# Patient Record
Sex: Female | Born: 1970
Health system: Southern US, Community
[De-identification: ages and names within clinical notes are randomized; demographics above are authoritative.]

## PROBLEM LIST (undated history)

## (undated) DIAGNOSIS — R269 Unspecified abnormalities of gait and mobility: Secondary | ICD-10-CM

## (undated) DIAGNOSIS — G809 Cerebral palsy, unspecified: Secondary | ICD-10-CM

## (undated) DIAGNOSIS — I10 Essential (primary) hypertension: Secondary | ICD-10-CM

## (undated) HISTORY — DX: Unspecified abnormalities of gait and mobility: R26.9

## (undated) HISTORY — PX: LEG SURGERY: SHX1003

## (undated) HISTORY — PX: EYE SURGERY: SHX253

## (undated) HISTORY — DX: Essential (primary) hypertension: I10

---

## 2002-04-22 HISTORY — PX: TUBAL LIGATION: SHX77

## 2005-06-29 ENCOUNTER — Emergency Department (HOSPITAL_COMMUNITY): Admission: EM | Admit: 2005-06-29 | Discharge: 2005-06-29 | Payer: Self-pay | Admitting: Family Medicine

## 2005-09-06 ENCOUNTER — Emergency Department (HOSPITAL_COMMUNITY): Admission: EM | Admit: 2005-09-06 | Discharge: 2005-09-06 | Payer: Self-pay | Admitting: Family Medicine

## 2006-08-23 ENCOUNTER — Emergency Department (HOSPITAL_COMMUNITY): Admission: EM | Admit: 2006-08-23 | Discharge: 2006-08-23 | Payer: Self-pay | Admitting: Emergency Medicine

## 2009-01-15 ENCOUNTER — Emergency Department (HOSPITAL_COMMUNITY): Admission: EM | Admit: 2009-01-15 | Discharge: 2009-01-15 | Payer: Self-pay | Admitting: Emergency Medicine

## 2009-11-05 ENCOUNTER — Emergency Department (HOSPITAL_COMMUNITY): Admission: EM | Admit: 2009-11-05 | Discharge: 2009-11-05 | Payer: Self-pay | Admitting: Emergency Medicine

## 2010-07-07 LAB — URINALYSIS, ROUTINE W REFLEX MICROSCOPIC
Ketones, ur: NEGATIVE mg/dL
Nitrite: POSITIVE — AB
Protein, ur: NEGATIVE mg/dL
Urobilinogen, UA: 0.2 mg/dL (ref 0.0–1.0)
pH: 5.5 (ref 5.0–8.0)

## 2010-07-07 LAB — URINE CULTURE
Colony Count: >=100000
Culture  Setup Time: 201107180001

## 2010-07-07 LAB — POCT I-STAT, CHEM 8
Chloride: 105 mEq/L (ref 96–112)
Hemoglobin: 10.5 g/dL — ABNORMAL LOW (ref 12.0–15.0)

## 2010-07-07 LAB — URINE MICROSCOPIC-ADD ON

## 2010-07-07 LAB — POCT PREGNANCY, URINE: Preg Test, Ur: NEGATIVE

## 2013-07-15 ENCOUNTER — Encounter (HOSPITAL_COMMUNITY): Payer: Self-pay | Admitting: Emergency Medicine

## 2013-07-15 ENCOUNTER — Emergency Department (INDEPENDENT_AMBULATORY_CARE_PROVIDER_SITE_OTHER)
Admission: EM | Admit: 2013-07-15 | Discharge: 2013-07-15 | Disposition: A | Discharge status: Home / Self Care | Payer: Self-pay | Source: Home / Self Care | Attending: Family Medicine | Admitting: Family Medicine

## 2013-07-15 DIAGNOSIS — R21 Rash and other nonspecific skin eruption: Secondary | ICD-10-CM

## 2013-07-15 DIAGNOSIS — I10 Essential (primary) hypertension: Secondary | ICD-10-CM

## 2013-07-15 LAB — POCT I-STAT, CHEM 8
BUN: 8 mg/dL (ref 6–23)
Calcium, Ion: 1.11 mmol/L — ABNORMAL LOW (ref 1.12–1.23)
Chloride: 101 mEq/L (ref 96–112)
Creatinine, Ser: 0.6 mg/dL (ref 0.50–1.10)
Glucose, Bld: 154 mg/dL — ABNORMAL HIGH (ref 70–99)
HCT: 34 % — ABNORMAL LOW (ref 36.0–46.0)
Hemoglobin: 11.6 g/dL — ABNORMAL LOW (ref 12.0–15.0)
POTASSIUM: 3.8 meq/L (ref 3.7–5.3)
SODIUM: 140 meq/L (ref 137–147)
TCO2: 24 mmol/L (ref 0–100)

## 2013-07-15 MED ORDER — HYDROCHLOROTHIAZIDE 25 MG PO TABS: 25.0000 mg | ORAL_TABLET | Freq: Every day | ORAL | Status: DC

## 2013-07-15 MED ORDER — TRIAMCINOLONE ACETONIDE 0.1 % EX CREA: 1.0000 "application " | TOPICAL_CREAM | Freq: Two times a day (BID) | CUTANEOUS | Status: DC

## 2013-07-15 NOTE — ED Notes (Signed)
C/o insect bites on right arm States she was told there was a spider nest in her daughters room where she was cleaning yesterday States bites does itch Areas do have pus coming out

## 2013-07-15 NOTE — ED Provider Notes (Signed)
Yvette Snyder is a 43 y.o. female who presents to Urgent Care today for rash. Patient developed several pruritic papules on right upper arm this morning. She suspects that she was going by spiders but denies ever seeing any spiders biting her. The papules are pruritic and not painful. No other family members have similar rash. She has not tried any treatment yet. No fevers chills nausea vomiting or diarrhea..  Additionally patient reports that she has had elevated blood pressure multiple times but has not been diagnosed with hypertension hour she taking any blood pressure medications. No chest pains palpitations or shortness of breath.   History reviewed. No pertinent past medical history. History  Substance Use Topics  . Smoking status: Not on file  . Smokeless tobacco: Not on file  . Alcohol Use: Not on file   ROS as above Medications: No current facility-administered medications for this encounter.   Current Outpatient Prescriptions  Medication Sig Dispense Refill  . hydrochlorothiazide (HYDRODIURIL) 25 MG tablet Take 1 tablet (25 mg total) by mouth daily.  30 tablet  2  . triamcinolone cream (KENALOG) 0.1 % Apply 1 application topically 2 (two) times daily.  30 g  1    Exam:  BP 182/91  Pulse 92  Temp(Src) 98.7 F (37.1 C) (Oral)  Resp 20  SpO2 98%  LMP 06/11/2013 Gen: Well NAD, morbidly obese HEENT: EOMI,  MMM Lungs: Normal work of breathing. CTABL Heart: RRR no MRG Abd: NABS, Soft. NT, ND Exts: Brisk capillary refill, warm and well perfused.  Skin: 5 small (<1cm in diameter) excoriated papules with crusting on the right upper arm. Non tender.   Results for orders placed during the hospital encounter of 07/15/13 (from the past 24 hour(s))  POCT I-STAT, CHEM 8     Status: Abnormal   Collection Time    07/15/13 11:10 AM      Result Value Ref Range   Sodium 140  137 - 147 mEq/L   Potassium 3.8  3.7 - 5.3 mEq/L   Chloride 101  96 - 112 mEq/L   BUN 8  6 - 23 mg/dL   Creatinine, Ser 1.300.60  0.50 - 1.10 mg/dL   Glucose, Bld [id number] (*) 70 - 99 mg/dL   Calcium, Ion 7.841.11 (*) 1.12 - 1.23 mmol/L   TCO2 24  0 - 100 mmol/L   Hemoglobin 11.6 (*) 12.0 - 15.0 g/dL   HCT 69.634.0 (*) 29.536.0 - 28.446.0 %   No results found.  Assessment and Plan: 43 y.o. female with  1) . Papules. Unclear etiology. Possibly that but bites however this not completely characteristic of them. We'll treat with triamcinolone cream and watchful waiting.  2) hypertension: Start hydrochlorothiazide. Refer to [facility]  3) elevated blood glucose. Concerning for diabetes. Patient will require evaluation with primary care provider.  Discussed warning signs or symptoms. Please see discharge instructions. Patient expresses understanding.    Rodolph BongEvan S Wojciech Willetts, MD 07/15/13 1115

## 2013-07-15 NOTE — Discharge Instructions (Signed)
Thank you for coming in today. Please apply triamcinolone cream twice daily until the skin starts looking normal. You can also use Gold Bond Itch over-the-counter for temporary control of itching. Start taking hydrochlorothiazide daily for blood pressure You also will need evaluation for diabetes.   [facility] [address] [place], [place] [address] [phone]  Please call or see Ms Antionette Char- for assistance with your bill.  You may qualify for reduced or free services.  Her phone number is [phone]. Her email is [email or web address]  Call or go to the emergency room if you get worse, have trouble breathing, have chest pains, or palpitations.    Arterial Hypertension Arterial hypertension (high blood pressure) is a condition of elevated pressure in your blood vessels. Hypertension over a long period of time is a risk factor for strokes, heart attacks, and heart failure. It is also the leading cause of kidney (renal) failure.  CAUSES   In Adults -- Over 90% of all hypertension has no known cause. This is called essential or primary hypertension. In the other 10% of people with hypertension, the increase in blood pressure is caused by another disorder. This is called secondary hypertension. Important causes of secondary hypertension are:  Heavy alcohol use.  Obstructive sleep apnea.  Hyperaldosterosim (Conn's syndrome).  Steroid use.  Chronic kidney failure.  Hyperparathyroidism.  Medications.  Renal artery stenosis.  Pheochromocytoma.  Cushing's disease.  Coarctation of the aorta.  Scleroderma renal crisis.  Licorice (in excessive amounts).  Drugs (cocaine, methamphetamine). Your caregiver can explain any items above that apply to you.  In Children -- Secondary hypertension is more common and should always be considered.  Pregnancy -- Few women of childbearing age have high blood pressure. However,  up to 10% of them develop hypertension of pregnancy. Generally, this will not harm the woman. It may be a sign of 3 complications of pregnancy: preeclampsia, HELLP syndrome, and eclampsia. Follow up and control with medication is necessary. SYMPTOMS   This condition normally does not produce any noticeable symptoms. It is usually found during a routine exam.  Malignant hypertension is a late problem of high blood pressure. It may have the following symptoms:  Headaches.  Blurred vision.  End-organ damage (this means your kidneys, heart, lungs, and other organs are being damaged).  Stressful situations can increase the blood pressure. If a person with normal blood pressure has their blood pressure go up while being seen by their caregiver, this is often termed "white coat hypertension." Its importance is not known. It may be related with eventually developing hypertension or complications of hypertension.  Hypertension is often confused with mental tension, stress, and anxiety. DIAGNOSIS  The diagnosis is made by 3 separate blood pressure measurements. They are taken at least 1 week apart from each other. If there is organ damage from hypertension, the diagnosis may be made without repeat measurements. Hypertension is usually identified by having blood pressure readings:  Above 140/90 mmHg measured in both arms, at 3 separate times, over a couple weeks.  Over 130/80 mmHg should be considered a risk factor and may require treatment in patients with diabetes. Blood pressure readings over 120/80 mmHg are called "pre-hypertension" even in non-diabetic patients. To get a true blood pressure measurement, use the following guidelines. Be aware of the factors that can alter blood pressure readings.  Take measurements at least 1 hour after caffeine.  Take measurements 30 minutes after smoking and without any stress. This is another  reason to quit smoking  it raises your blood pressure.  Use a  proper cuff size. Ask your caregiver if you are not sure about your cuff size.  Most home blood pressure cuffs are automatic. They will measure systolic and diastolic pressures. The systolic pressure is the pressure reading at the start of sounds. Diastolic pressure is the pressure at which the sounds disappear. If you are elderly, measure pressures in multiple postures. Try sitting, lying or standing.  Sit at rest for a minimum of 5 minutes before taking measurements.  You should not be on any medications like decongestants. These are found in many cold medications.  Record your blood pressure readings and review them with your caregiver. If you have hypertension:  Your caregiver may do tests to be sure you do not have secondary hypertension (see "causes" above).  Your caregiver may also look for signs of metabolic syndrome. This is also called Syndrome X or Insulin Resistance Syndrome. You may have this syndrome if you have type 2 diabetes, abdominal obesity, and abnormal blood lipids in addition to hypertension.  Your caregiver will take your medical and family history and perform a physical exam.  Diagnostic tests may include blood tests (for glucose, cholesterol, potassium, and kidney function), a urinalysis, or an EKG. Other tests may also be necessary depending on your condition. PREVENTION  There are important lifestyle issues that you can adopt to reduce your chance of developing hypertension:  Maintain a normal weight.  Limit the amount of salt (sodium) in your diet.  Exercise often.  Limit alcohol intake.  Get enough potassium in your diet. Discuss specific advice with your caregiver.  Follow a DASH diet (dietary approaches to stop hypertension). This diet is rich in fruits, vegetables, and low-fat dairy products, and avoids certain fats. PROGNOSIS  Essential hypertension cannot be cured. Lifestyle changes and medical treatment can lower blood pressure and reduce  complications. The prognosis of secondary hypertension depends on the underlying cause. Many people whose hypertension is controlled with medicine or lifestyle changes can live a normal, healthy life.  RISKS AND COMPLICATIONS  While high blood pressure alone is not an illness, it often requires treatment due to its short- and long-term effects on many organs. Hypertension increases your risk for:  CVAs or strokes (cerebrovascular accident).  Heart failure due to chronically high blood pressure (hypertensive cardiomyopathy).  Heart attack (myocardial infarction).  Damage to the retina (hypertensive retinopathy).  Kidney failure (hypertensive nephropathy). Your caregiver can explain list items above that apply to you. Treatment of hypertension can significantly reduce the risk of complications. TREATMENT   For overweight patients, weight loss and regular exercise are recommended. Physical fitness lowers blood pressure.  Mild hypertension is usually treated with diet and exercise. A diet rich in fruits and vegetables, fat-free dairy products, and foods low in fat and salt (sodium) can help lower blood pressure. Decreasing salt intake decreases blood pressure in a 1/3 of people.  Stop smoking if you are a smoker. The steps above are highly effective in reducing blood pressure. While these actions are easy to suggest, they are difficult to achieve. Most patients with moderate or severe hypertension end up requiring medications to bring their blood pressure down to a normal level. There are several classes of medications for treatment. Blood pressure pills (antihypertensives) will lower blood pressure by their different actions. Lowering the blood pressure by 10 mmHg may decrease the risk of complications by as much as 25%. The goal of treatment is effective  blood pressure control. This will reduce your risk for complications. Your caregiver will help you determine the best treatment for you  according to your lifestyle. What is excellent treatment for one person, may not be for you. HOME CARE INSTRUCTIONS   Do not smoke.  Follow the lifestyle changes outlined in the "Prevention" section.  If you are on medications, follow the directions carefully. Blood pressure medications must be taken as prescribed. Skipping doses reduces their benefit. It also puts you at risk for problems.  Follow up with your caregiver, as directed.  If you are asked to monitor your blood pressure at home, follow the guidelines in the "Diagnosis" section above. SEEK MEDICAL CARE IF:   You think you are having medication side effects.  You have recurrent headaches or lightheadedness.  You have swelling in your ankles.  You have trouble with your vision. SEEK IMMEDIATE MEDICAL CARE IF:   You have sudden onset of chest pain or pressure, difficulty breathing, or other symptoms of a heart attack.  You have a severe headache.  You have symptoms of a stroke (such as sudden weakness, difficulty speaking, difficulty walking). MAKE SURE YOU:   Understand these instructions.  Will watch your condition.  Will get help right away if you are not doing well or get worse. Document Released: 04/08/2005 Document Revised: 07/01/2011 Document Reviewed: 11/06/2006 [facility] Patient Information 2014 [place], [place].

## 2013-07-16 ENCOUNTER — Telehealth (HOSPITAL_COMMUNITY): Payer: Self-pay | Admitting: Family Medicine

## 2013-07-16 MED ORDER — AMLODIPINE BESYLATE 10 MG PO TABS: 10.0000 mg | ORAL_TABLET | Freq: Every day | ORAL | Status: DC

## 2013-07-16 NOTE — ED Notes (Signed)
[facility], who wises to advise of pt sulfa allergy. Discussed w Dr Denyse Amassorey, and pt is not to fill rx, and is instead to follow up w [facility] clinic

## 2013-07-16 NOTE — ED Notes (Signed)
Patient called clinic to discuss Rx w MD. MD called Rx in to pharmacy for patient

## 2013-07-16 NOTE — ED Notes (Signed)
Pt is allergic to sulfa.  HCTZ has cross reactivity.  Called in norvasc.    Rodolph BongEvan S Payton Moder, MD 07/16/13 1228

## 2013-09-30 ENCOUNTER — Encounter: Payer: Self-pay | Admitting: Internal Medicine

## 2013-09-30 ENCOUNTER — Ambulatory Visit: Payer: Self-pay | Attending: Internal Medicine | Admitting: Internal Medicine

## 2013-09-30 ENCOUNTER — Other Ambulatory Visit (HOSPITAL_COMMUNITY)
Admission: RE | Admit: 2013-09-30 | Discharge: 2013-09-30 | Disposition: A | Discharge status: Home / Self Care | Payer: Self-pay | Source: Ambulatory Visit | Attending: Internal Medicine | Admitting: Internal Medicine

## 2013-09-30 VITALS — BP 160/99 | HR 94 | Temp 98.6°F | Resp 16 | Ht 60.0 in | Wt 252.0 lb

## 2013-09-30 DIAGNOSIS — R079 Chest pain, unspecified: Secondary | ICD-10-CM | POA: Insufficient documentation

## 2013-09-30 DIAGNOSIS — Z7189 Other specified counseling: Secondary | ICD-10-CM

## 2013-09-30 DIAGNOSIS — Z7689 Persons encountering health services in other specified circumstances: Secondary | ICD-10-CM

## 2013-09-30 DIAGNOSIS — Y929 Unspecified place or not applicable: Secondary | ICD-10-CM | POA: Insufficient documentation

## 2013-09-30 DIAGNOSIS — R358 Other polyuria: Secondary | ICD-10-CM

## 2013-09-30 DIAGNOSIS — Z1151 Encounter for screening for human papillomavirus (HPV): Secondary | ICD-10-CM | POA: Insufficient documentation

## 2013-09-30 DIAGNOSIS — W57XXXA Bitten or stung by nonvenomous insect and other nonvenomous arthropods, initial encounter: Secondary | ICD-10-CM | POA: Insufficient documentation

## 2013-09-30 DIAGNOSIS — T148 Other injury of unspecified body region: Secondary | ICD-10-CM | POA: Insufficient documentation

## 2013-09-30 DIAGNOSIS — Z01419 Encounter for gynecological examination (general) (routine) without abnormal findings: Secondary | ICD-10-CM | POA: Insufficient documentation

## 2013-09-30 DIAGNOSIS — I10 Essential (primary) hypertension: Secondary | ICD-10-CM | POA: Insufficient documentation

## 2013-09-30 DIAGNOSIS — L538 Other specified erythematous conditions: Secondary | ICD-10-CM | POA: Insufficient documentation

## 2013-09-30 DIAGNOSIS — Z882 Allergy status to sulfonamides status: Secondary | ICD-10-CM | POA: Insufficient documentation

## 2013-09-30 DIAGNOSIS — R3589 Other polyuria: Secondary | ICD-10-CM

## 2013-09-30 DIAGNOSIS — Z23 Encounter for immunization: Secondary | ICD-10-CM

## 2013-09-30 DIAGNOSIS — Z79899 Other long term (current) drug therapy: Secondary | ICD-10-CM | POA: Insufficient documentation

## 2013-09-30 DIAGNOSIS — Z124 Encounter for screening for malignant neoplasm of cervix: Secondary | ICD-10-CM

## 2013-09-30 DIAGNOSIS — L304 Erythema intertrigo: Secondary | ICD-10-CM

## 2013-09-30 LAB — COMPLETE METABOLIC PANEL WITH GFR
ALBUMIN: 4.1 g/dL (ref 3.5–5.2)
ALK PHOS: 86 U/L (ref 39–117)
ALT: 13 U/L (ref 0–35)
AST: 11 U/L (ref 0–37)
BILIRUBIN TOTAL: 0.3 mg/dL (ref 0.2–1.2)
BUN: 11 mg/dL (ref 6–23)
CHLORIDE: 102 meq/L (ref 96–112)
CO2: 26 mEq/L (ref 19–32)
CREATININE: 0.55 mg/dL (ref 0.50–1.10)
Calcium: 9 mg/dL (ref 8.4–10.5)
GFR, Est Non African American: >89 mL/min
Glucose, Bld: 91 mg/dL (ref 70–99)
Potassium: 4.5 mEq/L (ref 3.5–5.3)
Sodium: 137 mEq/L (ref 135–145)
TOTAL PROTEIN: 7 g/dL (ref 6.0–8.3)

## 2013-09-30 LAB — POCT URINALYSIS DIPSTICK
Bilirubin, UA: NEGATIVE
Blood, UA: NEGATIVE
Glucose, UA: NEGATIVE
Ketones, UA: NEGATIVE
LEUKOCYTES UA: NEGATIVE
NITRITE UA: NEGATIVE
PH UA: 5.5
PROTEIN UA: NEGATIVE
Spec Grav, UA: >=1.03
Urobilinogen, UA: NEGATIVE

## 2013-09-30 LAB — CBC
HCT: 35.4 % — ABNORMAL LOW (ref 36.0–46.0)
Hemoglobin: 11.4 g/dL — ABNORMAL LOW (ref 12.0–15.0)
MCH: 22.9 pg — ABNORMAL LOW (ref 26.0–34.0)
MCHC: 32.2 g/dL (ref 30.0–36.0)
MCV: 71.1 fL — AB (ref 78.0–100.0)
Platelets: 351 10*3/uL (ref 150–400)
RBC: 4.98 MIL/uL (ref 3.87–5.11)
RDW: 17.2 % — ABNORMAL HIGH (ref 11.5–15.5)
WBC: 10.7 10*3/uL — ABNORMAL HIGH (ref 4.0–10.5)

## 2013-09-30 LAB — TSH: TSH: 1.449 u[IU]/mL (ref 0.350–4.500)

## 2013-09-30 LAB — HEMOGLOBIN A1C
Hgb A1c MFr Bld: 6 % — ABNORMAL HIGH (ref <5.7)
MEAN PLASMA GLUCOSE: 126 mg/dL — AB (ref <117)

## 2013-09-30 MED ORDER — NYSTATIN 100000 UNIT/GM EX CREA: 1.0000 "application " | TOPICAL_CREAM | Freq: Two times a day (BID) | CUTANEOUS | Status: DC

## 2013-09-30 MED ORDER — FLUCONAZOLE 150 MG PO TABS: 150.0000 mg | ORAL_TABLET | Freq: Once | ORAL | Status: DC

## 2013-09-30 MED ORDER — BACITRACIN 500 UNIT/GM EX OINT: 1.0000 "application " | TOPICAL_OINTMENT | Freq: Two times a day (BID) | CUTANEOUS | Status: DC

## 2013-09-30 MED ORDER — AMLODIPINE BESYLATE 10 MG PO TABS: 10.0000 mg | ORAL_TABLET | Freq: Every day | ORAL | Status: DC

## 2013-09-30 NOTE — Progress Notes (Signed)
Pt here to establish care for hypertension controlled with Amlodipine 10 mg daily. States she ran out 08/20/13 No other medical hx noted Pt was seen in ER 07/15/13 for multiple spider bites, prescribed Triamcinolone cream which she states is not working Bites noted to left arm,legs with itchiness LMP -5/17 Pt requesting pap smear Mother died of diabetes,copd

## 2013-09-30 NOTE — Progress Notes (Signed)
Patient ID: Elmarie Shiley, female   DOB: [birth year], 43 y.o.   MRN: [id number]  YQM:[id number]  [id number]:[id number]  DOB - [birth year]  CC:  Chief Complaint  Patient presents with  . Hospitalization Follow-up  . Hypertension  . Insect Bite       HPI: Keonte Standfield is a 43 y.o. female with a past medical history of cerebral palsy and HTN, here today to establish medical care.  Patient reports that she was seen at the urgent care on 3/26 with spider bites on R arms which have now resolved.  Today she presents with multiple pruritic insect bits on left arm for one week. Patient denies pain or warmth.  She reports that she has not had a regular physician due to insurance concerns and has been out of her Norvasc since April 30th.  She reports that she has never had a mammogram and has not had a pap smear in 11 years.  Patient needs tetanus vaccine today.   Patient has No headache, No chest pain, No abdominal pain - No Nausea, No new weakness tingling or numbness, No Cough - SOB.  Allergies  Allergen Reactions  . Sulfa Antibiotics    Past Medical History  Diagnosis Date  . Hypertension    Current Outpatient Prescriptions on File Prior to Visit  Medication Sig Dispense Refill  . triamcinolone cream (KENALOG) 0.1 % Apply 1 application topically 2 (two) times daily.  30 g  1  . amLODipine (NORVASC) 10 MG tablet Take 1 tablet (10 mg total) by mouth daily.  30 tablet  1   No current facility-administered medications on file prior to visit.   Family History  Problem Relation Age of Onset  . Diabetes Mother   . COPD Mother    History   Social History  . Marital Status: Married    Spouse Name: N/A    Number of Children: N/A  . Years of Education: N/A   Occupational History  . Not on file.   Social History Main Topics  . Smoking status: Never Smoker   . Smokeless tobacco: Not on file  . Alcohol Use: No  . Drug Use: No  . Sexual Activity: Not on file   Other Topics Concern  . Not  on file   Social History Narrative  . No narrative on file    Review of Systems  Constitutional: Negative.   HENT: Negative.   Eyes: Negative.   Respiratory: Negative.   Cardiovascular: Negative for chest pain, palpitations and leg swelling.  Gastrointestinal: Negative.   Genitourinary: Positive for dysuria (occasionally with pelvic pressure).       Polydipsia   Musculoskeletal: Negative.   Skin: Positive for itching.  Neurological: Negative.   Endo/Heme/Allergies: Negative for polydipsia.  Psychiatric/Behavioral: Positive for depression (since mom passed away one month ago).      Objective:   Filed Vitals:   09/30/13 0918  BP: 160/99  Pulse: 94  Temp: 98.6 F (37 C)  Resp: 16   Physical Exam  Constitutional: She is oriented to person, place, and time. She appears well-nourished. She appears distressed.  HENT:  Head: Normocephalic.  Right Ear: External ear normal.  Left Ear: External ear normal.  Nose: Nose normal.  Mouth/Throat: Oropharynx is clear and moist.  Eyes: Conjunctivae and EOM are normal. Pupils are equal, round, and reactive to light. No scleral icterus.  Neck: Normal range of motion. Neck supple. No JVD present.  Cardiovascular: Normal rate, regular rhythm and normal heart sounds.  Pulmonary/Chest: Effort normal and breath sounds normal. Right breast exhibits no mass, no nipple discharge and no tenderness. Left breast exhibits no mass, no nipple discharge and no tenderness.  Abdominal: Soft. Bowel sounds are normal. Hernia confirmed negative in the right inguinal area and confirmed negative in the left inguinal area.  Genitourinary: Uterus normal. There is rash on the right labia. There is rash on the left labia. Cervix exhibits discharge (thick white discharge). Cervix exhibits no motion tenderness and no friability. Right adnexum displays mass. Right adnexum displays no tenderness. Left adnexum displays mass. Left adnexum displays no tenderness. There  is erythema around the vagina.  Musculoskeletal: She exhibits no edema and no tenderness.  Patient has 5/5 strenght in all extremities Decreased ROM in bilateral lower extremities d/t history of CP.  Very stiff BLE.  Lymphadenopathy:    She has no cervical adenopathy.       Right: No inguinal adenopathy present.       Left: No inguinal adenopathy present.  Neurological: She is alert and oriented to person, place, and time. She has normal reflexes.  Skin: Skin is warm and dry.     Psychiatric: She has a normal mood and affect. Her behavior is normal.     Lab Results  Component Value Date   HGB 11.6* 07/15/2013   HCT 34.0* 07/15/2013   Lab Results  Component Value Date   CREATININE 0.60 07/15/2013   BUN 8 07/15/2013   NA 140 07/15/2013   K 3.8 07/15/2013   CL 101 07/15/2013    No results found for this basename: HGBA1C   Lipid Panel  No results found for this basename: chol, trig, hdl, cholhdl, vldl, ldlcalc       Assessment and plan:   Hilbert CorriganLorrie was seen today for hospitalization follow-up, hypertension and insect bite.  Diagnoses and associated orders for this visit:  HTN (hypertension) - amLODipine (NORVASC) 10 MG tablet; Take 1 tablet (10 mg total) by mouth daily.  Will follow up for recheck in one week  Morbid obesity Education provided on weight management Encounter to establish care - CBC - COMPLETE METABOLIC PANEL WITH GFR - Hemoglobin A1C - TSH - Vitamin D, 25-hydroxy - MM Digital Screening; Future - Tdap vaccine greater than or equal to 7yo IM  Papanicolaou smear - Cytology - PAP Iraan - Cervicovaginal ancillary only  Intertrigo - fluconazole (DIFLUCAN) 150 MG tablet; Take 1 tablet (150 mg total) by mouth once. - nystatin cream (MYCOSTATIN); Apply 1 application topically 2 (two) times daily. Use in skin folds for yeast Educated patient on staying dry to prevent candidiasis in skin folds Polyuria - POCT urinalysis dipstick-negative. Will test for  DM Insect bite - bacitracin 500 UNIT/GM ointment; Apply 1 application topically 2 (two) times daily. For insect bites on arm   Return for BP recheck on 17th, Nurse Visit.    Holland CommonsKECK, VALERIE, NP-C [facility] [phone] 09/30/2013, 9:36 AM

## 2013-09-30 NOTE — Patient Instructions (Signed)
DASH Diet  The DASH diet stands for "Dietary Approaches to Stop Hypertension." It is a healthy eating plan that has been shown to reduce high blood pressure (hypertension) in as little as 14 days, while also possibly providing other significant health benefits. These other health benefits include reducing the risk of breast cancer after menopause and reducing the risk of type 2 diabetes, heart disease, colon cancer, and stroke. Health benefits also include weight loss and slowing kidney failure in patients with chronic kidney disease.   DIET GUIDELINES  · Limit salt (sodium). Your diet should contain less than 1500 mg of sodium daily.  · Limit refined or processed carbohydrates. Your diet should include mostly whole grains. Desserts and added sugars should be used sparingly.  · Include small amounts of heart-healthy fats. These types of fats include nuts, oils, and tub margarine. Limit saturated and trans fats. These fats have been shown to be harmful in the body.  CHOOSING FOODS   The following food groups are based on a 2000 calorie diet. See your Registered Dietitian for individual calorie needs.  Grains and Grain Products (6 to 8 servings daily)  · Eat More Often: Whole-wheat bread, brown rice, whole-grain or wheat pasta, quinoa, popcorn without added fat or salt (air popped).  · Eat Less Often: White bread, white pasta, white rice, cornbread.  Vegetables (4 to 5 servings daily)  · Eat More Often: Fresh, frozen, and canned vegetables. Vegetables may be raw, steamed, roasted, or grilled with a minimal amount of fat.  · Eat Less Often/Avoid: Creamed or fried vegetables. Vegetables in a cheese sauce.  Fruit (4 to 5 servings daily)  · Eat More Often: All fresh, canned (in natural juice), or frozen fruits. Dried fruits without added sugar. One hundred percent fruit juice (½ cup [237 mL] daily).  · Eat Less Often: Dried fruits with added sugar. Canned fruit in light or heavy syrup.  Lean Meats, Fish, and Poultry (2  servings or less daily. One serving is 3 to 4 oz [85-114 g]).  · Eat More Often: Ninety percent or leaner ground beef, tenderloin, sirloin. Round cuts of beef, chicken breast, turkey breast. All fish. Grill, bake, or broil your meat. Nothing should be fried.  · Eat Less Often/Avoid: Fatty cuts of meat, turkey, or chicken leg, thigh, or wing. Fried cuts of meat or fish.  Dairy (2 to 3 servings)  · Eat More Often: Low-fat or fat-free milk, low-fat plain or light yogurt, reduced-fat or part-skim cheese.  · Eat Less Often/Avoid: Milk (whole, 2%). Whole milk yogurt. Full-fat cheeses.  Nuts, Seeds, and Legumes (4 to 5 servings per week)  · Eat More Often: All without added salt.  · Eat Less Often/Avoid: Salted nuts and seeds, canned beans with added salt.  Fats and Sweets (limited)  · Eat More Often: Vegetable oils, tub margarines without trans fats, sugar-free gelatin. Mayonnaise and salad dressings.  · Eat Less Often/Avoid: Coconut oils, palm oils, butter, stick margarine, cream, half and half, cookies, candy, pie.  FOR MORE INFORMATION  The Dash Diet Eating Plan: www.dashdiet.org  Document Released: 03/28/2011 Document Revised: 07/01/2011 Document Reviewed: 03/28/2011  [employer]® Patient Information ©2014 [employer].

## 2013-10-01 ENCOUNTER — Telehealth: Payer: Self-pay | Admitting: *Deleted

## 2013-10-01 LAB — VITAMIN D 25 HYDROXY (VIT D DEFICIENCY, FRACTURES): VIT D 25 HYDROXY: 34 ng/mL (ref 30–89)

## 2013-10-01 NOTE — Telephone Encounter (Signed)
I called pt just to f/u with her. She stated that she understood her treatment and that she enjoyed her experience here and also she wanted to remain Ms. [name] NP pt. She had no questions and said she will come in on her next appointment.

## 2013-10-04 ENCOUNTER — Telehealth: Payer: Self-pay | Admitting: Emergency Medicine

## 2013-10-04 LAB — CYTOLOGY - PAP

## 2013-10-04 MED ORDER — METRONIDAZOLE 500 MG PO TABS: 500.0000 mg | ORAL_TABLET | Freq: Two times a day (BID) | ORAL | Status: DC

## 2013-10-04 MED ORDER — FERROUS SULFATE 325 (65 FE) MG PO TBEC: 325.0000 mg | DELAYED_RELEASE_TABLET | Freq: Three times a day (TID) | ORAL | Status: AC

## 2013-10-04 NOTE — Telephone Encounter (Signed)
Message copied by Darlis LoanSMITH, Abisai Coble D on Mon Oct 04, 2013  2:59 PM ------      Message from: Ambrose FinlandKECK, VALERIE A      Created: Sun Oct 03, 2013  6:30 PM       Patient has iron deficiency anemia. Please send ferrous sulfate 325 mg to be taken TID. Patient may need stool softener to help with constipation. Negative for STD's but is positive for Bacterial Vaginosis. Please send metronidazole  Mg BID for 7 days, no alcohol with this medication.  Please reinforce weight loss and exercise with this patient.  Thanks ------

## 2013-10-04 NOTE — Telephone Encounter (Signed)
Pt given lab results/pap smear results. Instructed to start taking prescribed Ferrous Sulfate 325 mg TID and Flagyl 500 mg BID. Medications sent to [facility] pharmacy. Pt also reinforced the need to exercise as well as diet control. Pt expressed, she used to walk with hx cerebral palsy but after having daughter she is w/c bound. States its hard for her to exercise. Information provided on ways to exercise

## 2013-10-06 ENCOUNTER — Other Ambulatory Visit: Payer: Self-pay | Admitting: Internal Medicine

## 2013-10-06 ENCOUNTER — Ambulatory Visit: Payer: Self-pay | Attending: Internal Medicine

## 2013-10-06 ENCOUNTER — Telehealth: Payer: Self-pay

## 2013-10-06 NOTE — Telephone Encounter (Signed)
Patient is aware of her pap results.

## 2013-10-06 NOTE — Telephone Encounter (Signed)
Message copied by Lestine MountJUAREZ, DENISE L on Wed Oct 06, 2013  2:20 PM ------      Message from: Holland CommonsKECK, VALERIE A      Created: Tue Oct 05, 2013 10:33 PM       Pap is normal.  She will not need a repeat pap for 3 years. ------

## 2013-10-13 ENCOUNTER — Other Ambulatory Visit: Payer: Self-pay | Admitting: Internal Medicine

## 2013-10-13 DIAGNOSIS — Z803 Family history of malignant neoplasm of breast: Secondary | ICD-10-CM

## 2013-10-13 DIAGNOSIS — Z1231 Encounter for screening mammogram for malignant neoplasm of breast: Secondary | ICD-10-CM

## 2013-11-03 ENCOUNTER — Ambulatory Visit: Payer: Self-pay

## 2013-12-30 ENCOUNTER — Encounter: Payer: Self-pay | Admitting: Internal Medicine

## 2013-12-30 ENCOUNTER — Ambulatory Visit: Payer: Self-pay | Attending: Internal Medicine | Admitting: Internal Medicine

## 2013-12-30 VITALS — BP 142/98 | HR 99 | Temp 98.2°F | Resp 14 | Ht 60.0 in | Wt 249.0 lb

## 2013-12-30 DIAGNOSIS — Z79899 Other long term (current) drug therapy: Secondary | ICD-10-CM | POA: Insufficient documentation

## 2013-12-30 DIAGNOSIS — I10 Essential (primary) hypertension: Secondary | ICD-10-CM

## 2013-12-30 DIAGNOSIS — T148 Other injury of unspecified body region: Secondary | ICD-10-CM | POA: Insufficient documentation

## 2013-12-30 DIAGNOSIS — Z23 Encounter for immunization: Secondary | ICD-10-CM

## 2013-12-30 DIAGNOSIS — W57XXXA Bitten or stung by nonvenomous insect and other nonvenomous arthropods, initial encounter: Secondary | ICD-10-CM

## 2013-12-30 DIAGNOSIS — Z882 Allergy status to sulfonamides status: Secondary | ICD-10-CM | POA: Insufficient documentation

## 2013-12-30 MED ORDER — TRIAMCINOLONE ACETONIDE 0.1 % EX CREA: 1.0000 "application " | TOPICAL_CREAM | Freq: Two times a day (BID) | CUTANEOUS | Status: DC

## 2013-12-30 NOTE — Progress Notes (Signed)
Pt is here following up on her HTN and her bug bites on her arms and legs. Pt states that she injured her leg yesterday and she is now having occasional pain.

## 2013-12-30 NOTE — Progress Notes (Signed)
Patient ID: Yvette Snyder, female   DOB: [birth year], 43 y.o.   MRN: [id number]  CC: HTN, bug bites  HPI: Patient presents to clinic today for a re-evaluation of hypertension and bug bites.  She reports that she has been forgetting to take her blood pressure weekly.  She reports that she may only take her medication three times per week.  She reports that the same bug bites from last visit are present and are becoming increasingly pruritic.  She admits that she has two dogs at home that live indoors which have been itching as well.  She denies fevers or chills.  She also c/o of a pulled muscle in her right thigh since last night.   Allergies  Allergen Reactions  . Sulfa Antibiotics    Past Medical History  Diagnosis Date  . Hypertension    Current Outpatient Prescriptions on File Prior to Visit  Medication Sig Dispense Refill  . amLODipine (NORVASC) 10 MG tablet Take 1 tablet (10 mg total) by mouth daily.  30 tablet  2  . bacitracin 500 UNIT/GM ointment Apply 1 application topically 2 (two) times daily. For insect bites on arm  15 g  1  . nystatin cream (MYCOSTATIN) Apply 1 application topically 2 (two) times daily. Use in skin folds for yeast  30 g  0  . triamcinolone cream (KENALOG) 0.1 % Apply 1 application topically 2 (two) times daily.  30 g  1  . ferrous sulfate 325 (65 FE) MG EC tablet Take 1 tablet (325 mg total) by mouth 3 (three) times daily with meals.  90 tablet  0  . fluconazole (DIFLUCAN) 150 MG tablet Take 1 tablet (150 mg total) by mouth once.  1 tablet  0  . metroNIDAZOLE (FLAGYL) 500 MG tablet Take 1 tablet (500 mg total) by mouth 2 (two) times daily.  14 tablet  0   No current facility-administered medications on file prior to visit.   Family History  Problem Relation Age of Onset  . Diabetes Mother   . COPD Mother    History   Social History  . Marital Status: Married    Spouse Name: N/A    Number of Children: N/A  . Years of Education: N/A   Occupational  History  . Not on file.   Social History Main Topics  . Smoking status: Never Smoker   . Smokeless tobacco: Not on file  . Alcohol Use: No  . Drug Use: No  . Sexual Activity: Not on file   Other Topics Concern  . Not on file   Social History Narrative  . No narrative on file   Review of Systems  Constitutional: Negative for fever and chills.  Respiratory: Negative.   Cardiovascular: Negative for chest pain, palpitations and leg swelling.  Skin: Positive for itching and rash.  Neurological: Positive for headaches. Negative for dizziness.      Objective:   Filed Vitals:   12/30/13 0936  BP: 142/98  Pulse: 99  Temp: 98.2 F (36.8 C)  Resp: 14    Physical Exam  Constitutional: She is oriented to person, place, and time.  Cardiovascular: Normal rate, regular rhythm and normal heart sounds.   Pulmonary/Chest: Effort normal and breath sounds normal.  Musculoskeletal: She exhibits no edema and no tenderness.  Neurological: She is alert and oriented to person, place, and time.  Skin: Skin is warm and dry. Rash (generalized on BUE) noted. There is erythema.  Psychiatric: She has a normal mood  and affect. Thought content normal.     Lab Results  Component Value Date   WBC 10.7* 09/30/2013   HGB 11.4* 09/30/2013   HCT 35.4* 09/30/2013   MCV 71.1* 09/30/2013   PLT 351 09/30/2013   Lab Results  Component Value Date   CREATININE 0.55 09/30/2013   BUN 11 09/30/2013   NA 137 09/30/2013   K 4.5 09/30/2013   CL 102 09/30/2013   CO2 26 09/30/2013    Lab Results  Component Value Date   HGBA1C 6.0* 09/30/2013   Lipid Panel  No results found for this basename: chol, trig, hdl, cholhdl, vldl, ldlcalc       Assessment and plan:   Yvette Snyder was seen today for follow-up.  Diagnoses and associated orders for this visit:  Essential hypertension Patient is non-complaint with medication management.  Stressed the importance of compliance and risk associated with long standing  untreated hypertension.     Bug bites - triamcinolone cream (KENALOG) 0.1 %; Apply 1 application topically 2 (two) times daily. Looks as if they are consistent with flea bites. She may use benadryl for itch. Explained that she may need to have her dogs vaccinated and house cleaned thoroughly to get rid of fleas.  Need for prophylactic vaccination and inoculation against influenza Received  Return in about 3 months (around 03/31/2014) for Hypertension.      Holland Commons, NP-C [facility] [phone] 01/14/2014, 5:10 PM

## 2013-12-30 NOTE — Patient Instructions (Addendum)
Mat take benadryl for itch and use new cream twice per day   Insect Bite Mosquitoes, flies, fleas, bedbugs, and many other insects can bite. Insect bites are different from insect stings. A sting is when venom is injected into the skin. Some insect bites can transmit infectious diseases. SYMPTOMS  Insect bites usually turn red, swell, and itch for 2 to 4 days. They often go away on their own. TREATMENT  Your caregiver may prescribe antibiotic medicines if a bacterial infection develops in the bite. HOME CARE INSTRUCTIONS  Do not scratch the bite area.  Keep the bite area clean and dry. Wash the bite area thoroughly with soap and water.  Put ice or cool compresses on the bite area.  Put ice in a plastic bag.  Place a towel between your skin and the bag.  Leave the ice on for 20 minutes, 4 times a day for the first 2 to 3 days, or as directed.  You may apply a baking soda paste, cortisone cream, or calamine lotion to the bite area as directed by your caregiver. This can help reduce itching and swelling.  Only take over-the-counter or prescription medicines as directed by your caregiver.  If you are given antibiotics, take them as directed. Finish them even if you start to feel better. You may need a tetanus shot if:  You cannot remember when you had your last tetanus shot.  You have never had a tetanus shot.  The injury broke your skin. If you get a tetanus shot, your arm may swell, get red, and feel warm to the touch. This is common and not a problem. If you need a tetanus shot and you choose not to have one, there is a rare chance of getting tetanus. Sickness from tetanus can be serious. SEEK IMMEDIATE MEDICAL CARE IF:   You have increased pain, redness, or swelling in the bite area.  You see a red line on the skin coming from the bite.  You have a fever.  You have joint pain.  You have a headache or neck pain.  You have unusual weakness.  You have a rash.  You  have chest pain or shortness of breath.  You have abdominal pain, nausea, or vomiting.  You feel unusually tired or sleepy. MAKE SURE YOU:   Understand these instructions.  Will watch your condition.  Will get help right away if you are not doing well or get worse. Document Released: 05/16/2004 Document Revised: 07/01/2011 Document Reviewed: 11/07/2010 [employer] Patient Information 2015 [place], [place]. This information is not intended to replace advice given to you by your health care provider. Make sure you discuss any questions you have with your health care provider.

## 2014-01-11 DIAGNOSIS — I10 Essential (primary) hypertension: Secondary | ICD-10-CM | POA: Insufficient documentation

## 2014-02-21 ENCOUNTER — Ambulatory Visit: Payer: Self-pay | Attending: Internal Medicine | Admitting: Internal Medicine

## 2014-02-21 DIAGNOSIS — Z0000 Encounter for general adult medical examination without abnormal findings: Secondary | ICD-10-CM | POA: Insufficient documentation

## 2014-02-21 DIAGNOSIS — N912 Amenorrhea, unspecified: Secondary | ICD-10-CM

## 2014-02-21 LAB — POCT URINE PREGNANCY: PREG TEST UR: NEGATIVE

## 2014-02-21 NOTE — Patient Instructions (Signed)
Secondary Amenorrhea  Secondary amenorrhea is the stopping of menstrual flow for 3-6 months in a female who has previously had periods. There are many possible causes. Most of these causes are not serious. Usually, treating the underlying problem causing the loss of menses will return your periods to normal. CAUSES  Some common and uncommon causes of not menstruating include:  Malnutrition.  Low blood sugar (hypoglycemia).  Polycystic ovary disease.  Stress or fear.  Breastfeeding.  Hormone imbalance.  Ovarian failure.  Medicines.  Extreme obesity.  Cystic fibrosis.  Low body weight or drastic weight reduction from any cause.  Early menopause.  Removal of ovaries or uterus.  Contraceptives.  Illness.  Long-term (chronic) illnesses.  Cushing syndrome.  Thyroid problems.  Birth control pills, patches, or vaginal rings for birth control. RISK FACTORS You may be at greater risk of secondary amenorrhea if:  You have a family history of this condition.  You have an eating disorder.  You do athletic training. DIAGNOSIS  A diagnosis is made by your health care provider taking a medical history and doing a physical exam. This will include a pelvic exam to check for problems with your reproductive organs. Pregnancy must be ruled out. Often, numerous blood tests are done to measure different hormones in the body. Urine testing may be done. Specialized exams (ultrasound, CT scan, MRI, or hysteroscopy) may have to be done as well as measuring the body mass index (BMI). TREATMENT  Treatment depends on the cause of the amenorrhea. If an eating disorder is present, this can be treated with an adequate diet and therapy. Chronic illnesses may improve with treatment of the illness. Amenorrhea may be corrected with medicines, lifestyle changes, or surgery. If the amenorrhea cannot be corrected, it is sometimes possible to create a false menstruation with medicines. HOME CARE  INSTRUCTIONS  Maintain a healthy diet.  Manage weight problems.  Exercise regularly but not excessively.  Get adequate sleep.  Manage stress.  Be aware of changes in your menstrual cycle. Keep a record of when your periods occur. Note the date your period starts, how long it lasts, and any problems. SEEK MEDICAL CARE IF: Your symptoms do not get better with treatment. Document Released: 05/20/2006 Document Revised: 12/09/2012 Document Reviewed: 09/24/2012 [employer] Patient Information 2015 [employer]. This information is not intended to replace advice given to you by your health care provider. Make sure you discuss any questions you have with your health care provider.  

## 2014-02-21 NOTE — Progress Notes (Signed)
Patient ID: Yvette Snyder, female   DOB: 08/12/1970, 43 y.o.   MRN: [id number]   Yvette Snyder, is a 43 y.o. female  [id number]  [id number]  DOB - 08/12/1970  No chief complaint on file.       Subjective:   Yvette Snyder is a 43 y.o. female here today for a follow up visit. Patient with history of hypertension and cerebral palsy came to the clinic today with a major complaint of amenorrhea. Patient has not had a period for a few days, wondering if she is pregnant, requesting pregnancy test. She has no other complaints. Patient has No headache, No chest pain, No abdominal pain - No Nausea, No new weakness tingling or numbness, No Cough - SOB.  Problem  Preventative Health Care  Amenorrhea    ALLERGIES: Allergies  Allergen Reactions  . Sulfa Antibiotics     PAST MEDICAL HISTORY: Past Medical History  Diagnosis Date  . Hypertension     MEDICATIONS AT HOME: Prior to Admission medications   Medication Sig Start Date End Date Taking? Authorizing Provider  amLODipine (NORVASC) 10 MG tablet Take 1 tablet (10 mg total) by mouth daily. 09/30/13   Ambrose FinlandValerie A Keck, NP  bacitracin 500 UNIT/GM ointment Apply 1 application topically 2 (two) times daily. For insect bites on arm 09/30/13   Ambrose FinlandValerie A Keck, NP  ferrous sulfate 325 (65 FE) MG EC tablet Take 1 tablet (325 mg total) by mouth 3 (three) times daily with meals. 10/04/13   Ambrose FinlandValerie A Keck, NP  fluconazole (DIFLUCAN) 150 MG tablet Take 1 tablet (150 mg total) by mouth once. 09/30/13   Ambrose FinlandValerie A Keck, NP  metroNIDAZOLE (FLAGYL) 500 MG tablet Take 1 tablet (500 mg total) by mouth 2 (two) times daily. 10/04/13   Ambrose FinlandValerie A Keck, NP  nystatin cream (MYCOSTATIN) Apply 1 application topically 2 (two) times daily. Use in skin folds for yeast 09/30/13   Ambrose FinlandValerie A Keck, NP  triamcinolone cream (KENALOG) 0.1 % Apply 1 application topically 2 (two) times daily. 12/30/13   Ambrose FinlandValerie A Keck, NP     Objective:   There were no vitals filed for  this visit.  Exam General appearance : Awake, alert, not in any distress. Speech Clear. Not toxic looking HEENT: Atraumatic and Normocephalic, pupils equally reactive to light and accomodation Neck: supple, no JVD. No cervical lymphadenopathy.  Chest:Good air entry bilaterally, no added sounds  CVS: S1 S2 regular, no murmurs.  Abdomen: Bowel sounds present, Non tender and not distended with no gaurding, rigidity or rebound. Extremities: B/L Lower Ext shows no edema, both legs are warm to touch Neurology: Awake alert, and oriented X 3, CN II-XII intact, Non focal Skin:No Rash Wounds:N/A  Data Review Lab Results  Component Value Date   HGBA1C 6.0* 09/30/2013     Assessment & Plan   1. Preventative health care  - POCT urine pregnancy  2. Amenorrhea  - POCT urine pregnancy is negative today.  Patient was encouraged to practice relaxation techniques, she was educated on perimenopausal symptoms including infrequent menstrual period. Patient was reassured.  Return in about 6 months (around 08/22/2014), or if symptoms worsen or fail to improve, for Follow up HTN, Annual Physical.  The patient was given clear instructions to go to ER or return to medical center if symptoms don't improve, worsen or new problems develop. The patient verbalized understanding. The patient was told to call to get lab results if they haven't heard anything in the next week.  This note has been created with Education officer, environmentalDragon speech recognition software and smart phrase technology. Any transcriptional errors are unintentional.    Jeanann LewandowskyJEGEDE, Romy Ipock, MD, MHA, FACP, FAAP [facility] [place], [place] [phone]   02/21/2014, 11:31 AM

## 2014-04-11 ENCOUNTER — Ambulatory Visit: Payer: Self-pay | Admitting: Internal Medicine

## 2014-06-07 ENCOUNTER — Encounter (HOSPITAL_COMMUNITY): Payer: Self-pay | Admitting: Emergency Medicine

## 2014-06-07 ENCOUNTER — Encounter (HOSPITAL_COMMUNITY): Payer: Self-pay | Admitting: *Deleted

## 2014-06-07 ENCOUNTER — Emergency Department (INDEPENDENT_AMBULATORY_CARE_PROVIDER_SITE_OTHER)
Admission: EM | Admit: 2014-06-07 | Discharge: 2014-06-07 | Disposition: A | Discharge status: Nursing Home (Includes ICF) | Payer: Self-pay | Source: Home / Self Care | Attending: Family Medicine | Admitting: Family Medicine

## 2014-06-07 ENCOUNTER — Emergency Department (HOSPITAL_COMMUNITY)
Admission: EM | Admit: 2014-06-07 | Discharge: 2014-06-08 | Disposition: A | Discharge status: Home / Self Care | Payer: Self-pay | Attending: Emergency Medicine | Admitting: Emergency Medicine

## 2014-06-07 DIAGNOSIS — Z79899 Other long term (current) drug therapy: Secondary | ICD-10-CM | POA: Insufficient documentation

## 2014-06-07 DIAGNOSIS — Z3202 Encounter for pregnancy test, result negative: Secondary | ICD-10-CM | POA: Insufficient documentation

## 2014-06-07 DIAGNOSIS — I10 Essential (primary) hypertension: Secondary | ICD-10-CM | POA: Insufficient documentation

## 2014-06-07 DIAGNOSIS — Z8669 Personal history of other diseases of the nervous system and sense organs: Secondary | ICD-10-CM | POA: Insufficient documentation

## 2014-06-07 DIAGNOSIS — I160 Hypertensive urgency: Secondary | ICD-10-CM

## 2014-06-07 DIAGNOSIS — Z7952 Long term (current) use of systemic steroids: Secondary | ICD-10-CM | POA: Insufficient documentation

## 2014-06-07 DIAGNOSIS — R42 Dizziness and giddiness: Secondary | ICD-10-CM | POA: Insufficient documentation

## 2014-06-07 HISTORY — DX: Cerebral palsy, unspecified: G80.9

## 2014-06-07 LAB — COMPREHENSIVE METABOLIC PANEL
ALBUMIN: 3.5 g/dL (ref 3.5–5.2)
ALT: 14 U/L (ref 0–35)
ANION GAP: 9 (ref 5–15)
AST: 20 U/L (ref 0–37)
Alkaline Phosphatase: 91 U/L (ref 39–117)
BILIRUBIN TOTAL: 0.4 mg/dL (ref 0.3–1.2)
BUN: 6 mg/dL (ref 6–23)
CALCIUM: 8.7 mg/dL (ref 8.4–10.5)
CHLORIDE: 105 mmol/L (ref 96–112)
CO2: 24 mmol/L (ref 19–32)
CREATININE: 0.58 mg/dL (ref 0.50–1.10)
GFR calc Af Amer: >90 mL/min (ref >90)
GLUCOSE: 110 mg/dL — AB (ref 70–99)
Potassium: 3.6 mmol/L (ref 3.5–5.1)
Sodium: 138 mmol/L (ref 135–145)
TOTAL PROTEIN: 6.7 g/dL (ref 6.0–8.3)

## 2014-06-07 LAB — CBC WITH DIFFERENTIAL/PLATELET
BASOS ABS: 0 10*3/uL (ref 0.0–0.1)
BASOS PCT: 0 % (ref 0–1)
EOS ABS: 0.2 10*3/uL (ref 0.0–0.7)
EOS PCT: 1 % (ref 0–5)
HEMATOCRIT: 35.2 % — AB (ref 36.0–46.0)
HEMOGLOBIN: 10.7 g/dL — AB (ref 12.0–15.0)
LYMPHS PCT: 22 % (ref 12–46)
Lymphs Abs: 2.4 10*3/uL (ref 0.7–4.0)
MCH: 22.9 pg — AB (ref 26.0–34.0)
MCHC: 30.4 g/dL (ref 30.0–36.0)
MCV: 75.2 fL — ABNORMAL LOW (ref 78.0–100.0)
Monocytes Absolute: 0.5 10*3/uL (ref 0.1–1.0)
Monocytes Relative: 5 % (ref 3–12)
Neutro Abs: 7.7 10*3/uL (ref 1.7–7.7)
Neutrophils Relative %: 72 % (ref 43–77)
PLATELETS: 295 10*3/uL (ref 150–400)
RBC: 4.68 MIL/uL (ref 3.87–5.11)
RDW: 15.4 % (ref 11.5–15.5)
WBC: 10.8 10*3/uL — ABNORMAL HIGH (ref 4.0–10.5)

## 2014-06-07 LAB — URINALYSIS, ROUTINE W REFLEX MICROSCOPIC
BILIRUBIN URINE: NEGATIVE
GLUCOSE, UA: NEGATIVE mg/dL
Hgb urine dipstick: NEGATIVE
KETONES UR: NEGATIVE mg/dL
LEUKOCYTES UA: NEGATIVE
NITRITE: NEGATIVE
PH: 5.5 (ref 5.0–8.0)
Protein, ur: NEGATIVE mg/dL
Specific Gravity, Urine: 1.022 (ref 1.005–1.030)
Urobilinogen, UA: 0.2 mg/dL (ref 0.0–1.0)

## 2014-06-07 LAB — POC URINE PREG, ED: PREG TEST UR: NEGATIVE

## 2014-06-07 MED ORDER — AMLODIPINE BESYLATE 10 MG PO TABS: 10.0000 mg | ORAL_TABLET | Freq: Every day | ORAL | Status: DC

## 2014-06-07 NOTE — ED Provider Notes (Signed)
CSN: [id number]     Arrival date & time 06/07/14  1909 History   First MD Initiated Contact with Patient 06/07/14 1941     Chief Complaint  Patient presents with  . Dizziness   (Consider location/radiation/quality/duration/timing/severity/associated sxs/prior Treatment) Patient is a 44 y.o. female presenting with dizziness. The history is provided by the patient and the spouse.  Dizziness Quality:  Lightheadedness Severity:  Moderate Onset quality:  Sudden Duration:  12 hours Timing:  Constant Progression:  Unchanged Chronicity:  New Context comment:  Has not taken bp med for 1 wk., sx of presyncope. Associated symptoms: no chest pain, no headaches, no nausea, no palpitations and no shortness of breath     Past Medical History  Diagnosis Date  . Hypertension   . Cerebral palsy    Past Surgical History  Procedure Laterality Date  . Leg surgery Bilateral 1977, 1981, 1989    x 3  . Eye surgery Bilateral 4098,1191    x 2  . Cesarean section  2004  . Tubal ligation  2004   Family History  Problem Relation Age of Onset  . Diabetes Mother   . COPD Mother    History  Substance Use Topics  . Smoking status: Never Smoker   . Smokeless tobacco: Not on file  . Alcohol Use: No   OB History    No data available     Review of Systems  Constitutional: Negative.   Respiratory: Negative for shortness of breath.   Cardiovascular: Negative for chest pain and palpitations.  Gastrointestinal: Negative for nausea.  Neurological: Positive for dizziness and light-headedness. Negative for numbness and headaches.    Allergies  Sulfa antibiotics  Home Medications   Prior to Admission medications   Medication Sig Start Date End Date Taking? Authorizing Provider  amLODipine (NORVASC) 10 MG tablet Take 1 tablet (10 mg total) by mouth daily. 09/30/13  Yes Ambrose Finland, NP  bacitracin 500 UNIT/GM ointment Apply 1 application topically 2 (two) times daily. For insect bites on arm  09/30/13   Ambrose Finland, NP  ferrous sulfate 325 (65 FE) MG EC tablet Take 1 tablet (325 mg total) by mouth 3 (three) times daily with meals. 10/04/13   Ambrose Finland, NP  fluconazole (DIFLUCAN) 150 MG tablet Take 1 tablet (150 mg total) by mouth once. 09/30/13   Ambrose Finland, NP  metroNIDAZOLE (FLAGYL) 500 MG tablet Take 1 tablet (500 mg total) by mouth 2 (two) times daily. 10/04/13   Ambrose Finland, NP  nystatin cream (MYCOSTATIN) Apply 1 application topically 2 (two) times daily. Use in skin folds for yeast 09/30/13   Ambrose Finland, NP  triamcinolone cream (KENALOG) 0.1 % Apply 1 application topically 2 (two) times daily. 12/30/13   Ambrose Finland, NP   BP 175/109 mmHg  Pulse 111  Temp(Src) 99.2 F (37.3 C) (Oral)  Resp 23  SpO2 98%  LMP 05/10/2014 Physical Exam  Constitutional: She is oriented to person, place, and time. She appears well-developed and well-nourished. No distress.  HENT:  Right Ear: External ear normal.  Left Ear: External ear normal.  Neck: Normal range of motion. Neck supple.  Cardiovascular: Normal heart sounds and intact distal pulses.   Pulmonary/Chest: Effort normal and breath sounds normal.  Lymphadenopathy:    She has no cervical adenopathy.  Neurological: She is alert and oriented to person, place, and time. No cranial nerve deficit. Coordination normal.  Skin: Skin is warm and dry.  Nursing note  and vitals reviewed.   ED Course  Procedures (including critical care time) Labs Review Labs Reviewed - No data to display  Imaging Review No results found.   MDM   1. Hypertensive urgency    Sent for care of symptomatic hypertension.   Linna HoffJames D Kindl, MD 06/07/14 [phone]

## 2014-06-07 NOTE — ED Notes (Signed)
C/o dizziness onset 0700.  States she took her medicine today but not like she is supposed to.

## 2014-06-07 NOTE — ED Notes (Signed)
Patient reports dizziness that has worsened throughout the day.  No nausea, no vomiting, no pain.  C/o dizziness and feels like passing out.  This has been going on all day .  Patient has not contacted her pcp.  Skin warm and dry.

## 2014-06-07 NOTE — ED Notes (Signed)
Pt. reports elevated blood pressure for several days , pt. stated she has not taken her blood pressure medications for several days with dizziness.

## 2014-06-07 NOTE — Discharge Instructions (Signed)
Began taking your amlodipine as previously prescribed.  Keep a record of your blood pressures and follow up with your primary Dr. in the next week.   Hypertension Hypertension, commonly called high blood pressure, is when the force of blood pumping through your arteries is too strong. Your arteries are the blood vessels that carry blood from your heart throughout your body. A blood pressure reading consists of a higher number over a lower number, such as 110/72. The higher number (systolic) is the pressure inside your arteries when your heart pumps. The lower number (diastolic) is the pressure inside your arteries when your heart relaxes. Ideally you want your blood pressure below 120/80. Hypertension forces your heart to work harder to pump blood. Your arteries may become narrow or stiff. Having hypertension puts you at risk for heart disease, stroke, and other problems.  RISK FACTORS Some risk factors for high blood pressure are controllable. Others are not.  Risk factors you cannot control include:   Race. You may be at higher risk if you are African American.  Age. Risk increases with age.  Gender. Men are at higher risk than women before age 45 years. After age 45, women are at higher risk than men. Risk factors you can control include:  Not getting enough exercise or physical activity.  Being overweight.  Getting too much fat, sugar, calories, or salt in your diet.  Drinking too much alcohol. SIGNS AND SYMPTOMS Hypertension does not usually cause signs or symptoms. Extremely high blood pressure (hypertensive crisis) may cause headache, anxiety, shortness of breath, and nosebleed. DIAGNOSIS  To check if you have hypertension, your health care provider will measure your blood pressure while you are seated, with your arm held at the level of your heart. It should be measured at least twice using the same arm. Certain conditions can cause a difference in blood pressure between your  right and left arms. A blood pressure reading that is higher than normal on one occasion does not mean that you need treatment. If one blood pressure reading is high, ask your health care provider about having it checked again. TREATMENT  Treating high blood pressure includes making lifestyle changes and possibly taking medicine. Living a healthy lifestyle can help lower high blood pressure. You may need to change some of your habits. Lifestyle changes may include:  Following the DASH diet. This diet is high in fruits, vegetables, and whole grains. It is low in salt, red meat, and added sugars.  Getting at least 2 hours of brisk physical activity every week.  Losing weight if necessary.  Not smoking.  Limiting alcoholic beverages.  Learning ways to reduce stress. If lifestyle changes are not enough to get your blood pressure under control, your health care provider may prescribe medicine. You may need to take more than one. Work closely with your health care provider to understand the risks and benefits. HOME CARE INSTRUCTIONS  Have your blood pressure rechecked as directed by your health care provider.   Take medicines only as directed by your health care provider. Follow the directions carefully. Blood pressure medicines must be taken as prescribed. The medicine does not work as well when you skip doses. Skipping doses also puts you at risk for problems.   Do not smoke.   Monitor your blood pressure at home as directed by your health care provider. SEEK MEDICAL CARE IF:   You think you are having a reaction to medicines taken.  You have recurrent headaches or feel  dizzy.  You have swelling in your ankles.  You have trouble with your vision. SEEK IMMEDIATE MEDICAL CARE IF:  You develop a severe headache or confusion.  You have unusual weakness, numbness, or feel faint.  You have severe chest or abdominal pain.  You vomit repeatedly.  You have trouble  breathing. MAKE SURE YOU:   Understand these instructions.  Will watch your condition.  Will get help right away if you are not doing well or get worse. Document Released: 04/08/2005 Document Revised: 08/23/2013 Document Reviewed: 01/29/2013 [facility] Patient Information 2015 [place], [place]. This information is not intended to replace advice given to you by your health care provider. Make sure you discuss any questions you have with your health care provider.

## 2014-06-07 NOTE — ED Notes (Signed)
Pt still reporting moderate dizziness.

## 2014-06-07 NOTE — ED Provider Notes (Signed)
CSN: [id number]     Arrival date & time 06/07/14  1958 History   First MD Initiated Contact with Patient 06/07/14 2215     Chief Complaint  Patient presents with  . Hypertension  . Dizziness     (Consider location/radiation/quality/duration/timing/severity/associated sxs/prior Treatment) HPI Comments: Patient is a 44 year old female with history of cerebral palsy and hypertension. She was sent from urgent care for evaluation of elevated blood pressure. Patient tells me she has been off of her amlodipine for the past 3 days as she ran out of this. During this same 3 days, she has felt dizzy and lightheaded. Her blood pressures of been elevated at home. She denies to me she is having any significant headache, chest pain, difficulty breathing, or other complaints.  Patient is a 44 y.o. female presenting with hypertension. The history is provided by the patient.  Hypertension This is a new problem. Episode onset: 3 days ago. The problem occurs constantly. The problem has been gradually worsening. Pertinent negatives include no abdominal pain. Nothing aggravates the symptoms. Nothing relieves the symptoms. She has tried nothing for the symptoms. The treatment provided no relief.    Past Medical History  Diagnosis Date  . Hypertension   . Cerebral palsy    Past Surgical History  Procedure Laterality Date  . Leg surgery Bilateral 1977, 1981, 1989    x 3  . Eye surgery Bilateral [id number]    x 2  . Cesarean section  2004  . Tubal ligation  2004   Family History  Problem Relation Age of Onset  . Diabetes Mother   . COPD Mother    History  Substance Use Topics  . Smoking status: Never Smoker   . Smokeless tobacco: Not on file  . Alcohol Use: No   OB History    No data available     Review of Systems  Gastrointestinal: Negative for abdominal pain.  All other systems reviewed and are negative.     Allergies  Sulfa antibiotics  Home Medications   Prior to Admission  medications   Medication Sig Start Date End Date Taking? Authorizing Provider  amLODipine (NORVASC) 10 MG tablet Take 1 tablet (10 mg total) by mouth daily. 09/30/13   Ambrose FinlandValerie A Keck, NP  bacitracin 500 UNIT/GM ointment Apply 1 application topically 2 (two) times daily. For insect bites on arm 09/30/13   Ambrose FinlandValerie A Keck, NP  ferrous sulfate 325 (65 FE) MG EC tablet Take 1 tablet (325 mg total) by mouth 3 (three) times daily with meals. 10/04/13   Ambrose FinlandValerie A Keck, NP  fluconazole (DIFLUCAN) 150 MG tablet Take 1 tablet (150 mg total) by mouth once. 09/30/13   Ambrose FinlandValerie A Keck, NP  metroNIDAZOLE (FLAGYL) 500 MG tablet Take 1 tablet (500 mg total) by mouth 2 (two) times daily. 10/04/13   Ambrose FinlandValerie A Keck, NP  nystatin cream (MYCOSTATIN) Apply 1 application topically 2 (two) times daily. Use in skin folds for yeast 09/30/13   Ambrose FinlandValerie A Keck, NP  triamcinolone cream (KENALOG) 0.1 % Apply 1 application topically 2 (two) times daily. 12/30/13   Ambrose FinlandValerie A Keck, NP   BP 144/95 mmHg  Pulse 112  Temp(Src) 98.3 F (36.8 C) (Oral)  Resp 16  Ht 4\' 11"  (1.499 m)  Wt 252 lb (114.306 kg)  BMI 50.87 kg/m2  SpO2 99%  LMP 05/10/2014 Physical Exam  Constitutional: She is oriented to person, place, and time. She appears well-developed and well-nourished. No distress.  HENT:  Head: Normocephalic and  atraumatic.  Mouth/Throat: Oropharynx is clear and moist.  Eyes: EOM are normal. Pupils are equal, round, and reactive to light.  Neck: Normal range of motion. Neck supple.  Cardiovascular: Normal rate and regular rhythm.  Exam reveals no gallop and no friction rub.   No murmur heard. Pulmonary/Chest: Effort normal and breath sounds normal. No respiratory distress. She has no wheezes.  Abdominal: Soft. Bowel sounds are normal. She exhibits no distension. There is no tenderness.  Musculoskeletal: Normal range of motion. She exhibits no edema.  Neurological: She is alert and oriented to person, place, and time. No cranial nerve  deficit. She exhibits normal muscle tone. Coordination normal.  Skin: Skin is warm and dry. She is not diaphoretic.  Nursing note and vitals reviewed.   ED Course  Procedures (including critical care time) Labs Review Labs Reviewed  CBC WITH DIFFERENTIAL/PLATELET - Abnormal; Notable for the following:    WBC 10.8 (*)    Hemoglobin 10.7 (*)    HCT 35.2 (*)    MCV 75.2 (*)    MCH 22.9 (*)    All other components within normal limits  COMPREHENSIVE METABOLIC PANEL - Abnormal; Notable for the following:    Glucose, Bld 110 (*)    All other components within normal limits  URINALYSIS, ROUTINE W REFLEX MICROSCOPIC  POC URINE PREG, ED    Imaging Review No results found.   EKG Interpretation None      MDM   Final diagnoses:  None    Patient presents with complaints of elevated blood pressure and dizziness. Her blood pressure was initially elevated, however significantly improved shortly thereafter. Her laboratory studies do not reveal any evidence for endorgan damage and her neurologic exam is nonfocal. She has been off her blood pressure medications for the past several days coinciding with her symptoms. She will be given prescriptions for refills of this. She is to follow-up with her primary doctor and keep a record of her blood pressures.    Geoffery Lyons, MD 06/07/14 [phone]

## 2014-06-13 ENCOUNTER — Ambulatory Visit: Payer: Self-pay | Admitting: Internal Medicine

## 2014-06-17 ENCOUNTER — Ambulatory Visit: Payer: Self-pay | Attending: Internal Medicine | Admitting: Internal Medicine

## 2014-06-17 ENCOUNTER — Encounter: Payer: Self-pay | Admitting: Internal Medicine

## 2014-06-17 VITALS — BP 151/95 | HR 101 | Temp 98.0°F | Resp 16 | Ht 60.0 in | Wt 255.0 lb

## 2014-06-17 DIAGNOSIS — I10 Essential (primary) hypertension: Secondary | ICD-10-CM

## 2014-06-17 DIAGNOSIS — G809 Cerebral palsy, unspecified: Secondary | ICD-10-CM | POA: Insufficient documentation

## 2014-06-17 DIAGNOSIS — Z882 Allergy status to sulfonamides status: Secondary | ICD-10-CM | POA: Insufficient documentation

## 2014-06-17 DIAGNOSIS — Z79899 Other long term (current) drug therapy: Secondary | ICD-10-CM | POA: Insufficient documentation

## 2014-06-17 LAB — POCT GLYCOSYLATED HEMOGLOBIN (HGB A1C): HEMOGLOBIN A1C: 5.9

## 2014-06-17 MED ORDER — LISINOPRIL 10 MG PO TABS: 10.0000 mg | ORAL_TABLET | Freq: Every day | ORAL | Status: DC

## 2014-06-17 MED ORDER — CLONIDINE HCL 0.1 MG PO TABS
0.1000 mg | ORAL_TABLET | Freq: Once | ORAL | Status: AC
  Administered 2014-06-17: 0.1 mg via ORAL

## 2014-06-17 NOTE — Patient Instructions (Signed)

## 2014-06-17 NOTE — Progress Notes (Signed)
HFU w/ HBP Pt is requesting medication to help her loose weight since her condition keeps her from exercising.

## 2014-06-17 NOTE — Progress Notes (Signed)
Patient ID: Yvette Snyder, female   DOB: Sep 12, 1970, 44 y.o.   MRN: [id number]  CC: hypertension  HPI: Patient is a 44 year old female with history of cerebral palsy and hypertension. She was sent from urgent care for evaluation of elevated blood pressure. Patient tells me she has been off of her amlodipine for the past 3 days as she ran out of this. During this same 3 days, she has felt dizzy and lightheaded. Her blood pressures of been elevated at home. She denies to me she is having any significant headache, chest pain, difficulty breathing, or other complaints  Patient has No headache, No chest pain, No abdominal pain - No Nausea, No new weakness tingling or numbness, No Cough - SOB.  Allergies  Allergen Reactions  . Sulfa Antibiotics Swelling    Swelling in throat and stops breathing   Past Medical History  Diagnosis Date  . Hypertension   . Cerebral palsy    Current Outpatient Prescriptions on File Prior to Visit  Medication Sig Dispense Refill  . amLODipine (NORVASC) 10 MG tablet Take 1 tablet (10 mg total) by mouth daily. 30 tablet 1  . bacitracin 500 UNIT/GM ointment Apply 1 application topically 2 (two) times daily. For insect bites on arm (Patient not taking: Reported on 06/07/2014) 15 g 1  . ferrous sulfate 325 (65 FE) MG EC tablet Take 1 tablet (325 mg total) by mouth 3 (three) times daily with meals. (Patient not taking: Reported on 06/07/2014) 90 tablet 0  . fluconazole (DIFLUCAN) 150 MG tablet Take 1 tablet (150 mg total) by mouth once. (Patient not taking: Reported on 06/07/2014) 1 tablet 0  . metroNIDAZOLE (FLAGYL) 500 MG tablet Take 1 tablet (500 mg total) by mouth 2 (two) times daily. (Patient not taking: Reported on 06/07/2014) 14 tablet 0  . nystatin cream (MYCOSTATIN) Apply 1 application topically 2 (two) times daily. Use in skin folds for yeast (Patient not taking: Reported on 06/07/2014) 30 g 0  . triamcinolone cream (KENALOG) 0.1 % Apply 1 application topically 2  (two) times daily. (Patient not taking: Reported on 06/07/2014) 85.2 g 1   No current facility-administered medications on file prior to visit.   Family History  Problem Relation Age of Onset  . Diabetes Mother   . COPD Mother    History   Social History  . Marital Status: Married    Spouse Name: N/A  . Number of Children: N/A  . Years of Education: N/A   Occupational History  . Not on file.   Social History Main Topics  . Smoking status: Never Smoker   . Smokeless tobacco: Not on file  . Alcohol Use: No  . Drug Use: No  . Sexual Activity: Yes    Birth Control/ Protection: Surgical   Other Topics Concern  . Not on file   Social History Narrative    Review of Systems: See history of present illness   Objective:   Filed Vitals:   06/17/14 1659  BP: 162/114  Pulse: 101  Temp: 98 F (36.7 C)  Resp: 16    Physical Exam  Constitutional: She is oriented to person, place, and time.  Cardiovascular: Normal rate, regular rhythm and normal heart sounds.   Pulmonary/Chest: Effort normal and breath sounds normal.  Musculoskeletal: She exhibits no edema.  Neurological: She is alert and oriented to person, place, and time.     Lab Results  Component Value Date   WBC 10.8* 06/07/2014   HGB 10.7* 06/07/2014  HCT 35.2* 06/07/2014   MCV 75.2* 06/07/2014   PLT 295 06/07/2014   Lab Results  Component Value Date   CREATININE 0.58 06/07/2014   BUN 6 06/07/2014   NA 138 06/07/2014   K 3.6 06/07/2014   CL 105 06/07/2014   CO2 24 06/07/2014    Lab Results  Component Value Date   HGBA1C 6.0* 09/30/2013   Lipid Panel  No results found for: CHOL, TRIG, HDL, CHOLHDL, VLDL, LDLCALC     Assessment and plan:   Yvette Snyder was seen today for follow-up.  Diagnoses and all orders for this visit:  Essential hypertension Orders: -     cloNIDine (CATAPRES) tablet 0.1 mg; Take 1 tablet (0.1 mg total) by mouth once. -    Begin lisinopril (PRINIVIL,ZESTRIL) 10 MG tablet;  Take 1 tablet (10 mg total) by mouth daily. Stressed medication compliance with patient and need to order refills at least one week before running out of medication.  Morbid obesity Orders: -     POCT glycosylated hemoglobin (Hb A1C) Stressed pole therapy with patient due to cerebral palsy and medical conditions in order to help with weight loss.  Return in about 2 weeks (around 07/01/2014) for Nurse Visit-BP check.       Holland Commons, NP-C [facility] [phone] 06/17/2014, 5:16 PM

## 2014-06-27 ENCOUNTER — Ambulatory Visit: Payer: Self-pay

## 2014-06-27 NOTE — Progress Notes (Unsigned)
Patient presents to clinic saying that her blood pressures have been running in the 140/150's systolic.  Patient also states she has been feeling more tired than usual.  Blood pressure today was 148/82 , pulse was 94.  Spoke with provider Holland CommonsValerie Keck who stated that the tired feeling was most likely related to the new medication clonidine and that she her blood pressure was stable at this time.  I explained this to the patient and told her to keep her scheduled appointment on 3/17 or she was welcome to check with scheduling to see of she could get a quicker appointment.  Patient understood and all questions answered.  Patient discharged in stable condition.

## 2014-06-28 ENCOUNTER — Ambulatory Visit: Payer: Self-pay | Attending: Internal Medicine

## 2014-07-07 ENCOUNTER — Ambulatory Visit: Payer: Self-pay | Attending: Internal Medicine

## 2014-07-07 MED ORDER — HYDROCHLOROTHIAZIDE 25 MG PO TABS: 25.0000 mg | ORAL_TABLET | Freq: Every day | ORAL | Status: DC

## 2014-07-07 NOTE — Patient Instructions (Signed)
Continue taking Lisinopril 10 mg tab with Amlodipine 10 mg tab  Return next week for nurse visit to recheck Blood Pressure

## 2014-07-07 NOTE — Progress Notes (Unsigned)
Patient ID: Yvette ShileyLorrie A Kirsten, female   DOB: 1970/10/10, 44 y.o.   MRN: [id number] Pt comes in today for blood pressure recheck s/p elevated BP during last office visit 06/17/14 Per Provider, Lisinopril 10 mg added to BP regimen Pt states she stopped taking medication due to fatigue Denies headache,dizziness or pain BP- 145/90 99 After speaking with Dr. Hyman HopesJegede, the plan is to have pt return next week for nurse visit, BP recheck If BP>145/90 increase Lisinopril to 20 mg and/or d/c Amlodipine 10 mg  Will schedule nurse visit

## 2014-07-14 ENCOUNTER — Ambulatory Visit: Payer: Self-pay | Attending: Internal Medicine | Admitting: *Deleted

## 2014-07-14 VITALS — BP 130/80 | HR 102 | Temp 98.8°F | Resp 18

## 2014-07-14 DIAGNOSIS — Z0130 Encounter for examination of blood pressure without abnormal findings: Secondary | ICD-10-CM | POA: Insufficient documentation

## 2014-07-14 NOTE — Patient Instructions (Signed)
DASH Eating Plan °DASH stands for "Dietary Approaches to Stop Hypertension." The DASH eating plan is a healthy eating plan that has been shown to reduce high blood pressure (hypertension). Additional health benefits may include reducing the risk of type 2 diabetes mellitus, heart disease, and stroke. The DASH eating plan may also help with weight loss. °WHAT DO I NEED TO KNOW ABOUT THE DASH EATING PLAN? °For the DASH eating plan, you will follow these general guidelines: °· Choose foods with a percent daily value for sodium of less than 5% (as listed on the food label). °· Use salt-free seasonings or herbs instead of table salt or sea salt. °· Check with your health care provider or pharmacist before using salt substitutes. °· Eat lower-sodium products, often labeled as "lower sodium" or "no salt added." °· Eat fresh foods. °· Eat more vegetables, fruits, and low-fat dairy products. °· Choose whole grains. Look for the word "whole" as the first word in the ingredient list. °· Choose fish and skinless chicken or turkey more often than red meat. Limit fish, poultry, and meat to 6 oz (170 g) each day. °· Limit sweets, desserts, sugars, and sugary drinks. °· Choose heart-healthy fats. °· Limit cheese to 1 oz (28 g) per day. °· Eat more home-cooked food and less restaurant, buffet, and fast food. °· Limit fried foods. °· Cook foods using methods other than frying. °· Limit canned vegetables. If you do use them, rinse them well to decrease the sodium. °· When eating at a restaurant, ask that your food be prepared with less salt, or no salt if possible. °WHAT FOODS CAN I EAT? °Seek help from a dietitian for individual calorie needs. °Grains °Whole grain or whole wheat bread. Brown rice. Whole grain or whole wheat pasta. Quinoa, bulgur, and whole grain cereals. Low-sodium cereals. Corn or whole wheat flour tortillas. Whole grain cornbread. Whole grain crackers. Low-sodium crackers. °Vegetables °Fresh or frozen vegetables  (raw, steamed, roasted, or grilled). Low-sodium or reduced-sodium tomato and vegetable juices. Low-sodium or reduced-sodium tomato sauce and paste. Low-sodium or reduced-sodium canned vegetables.  °Fruits °All fresh, canned (in natural juice), or frozen fruits. °Meat and Other Protein Products °Ground beef (85% or leaner), grass-fed beef, or beef trimmed of fat. Skinless chicken or turkey. Ground chicken or turkey. Pork trimmed of fat. All fish and seafood. Eggs. Dried beans, peas, or lentils. Unsalted nuts and seeds. Unsalted canned beans. °Dairy °Low-fat dairy products, such as skim or 1% milk, 2% or reduced-fat cheeses, low-fat ricotta or cottage cheese, or plain low-fat yogurt. Low-sodium or reduced-sodium cheeses. °Fats and Oils °Tub margarines without trans fats. Light or reduced-fat mayonnaise and salad dressings (reduced sodium). Avocado. Safflower, olive, or canola oils. Natural peanut or almond butter. °Other °Unsalted popcorn and pretzels. °The items listed above may not be a complete list of recommended foods or beverages. Contact your dietitian for more options. °WHAT FOODS ARE NOT RECOMMENDED? °Grains °White bread. White pasta. White rice. Refined cornbread. Bagels and croissants. Crackers that contain trans fat. °Vegetables °Creamed or fried vegetables. Vegetables in a cheese sauce. Regular canned vegetables. Regular canned tomato sauce and paste. Regular tomato and vegetable juices. °Fruits °Dried fruits. Canned fruit in light or heavy syrup. Fruit juice. °Meat and Other Protein Products °Fatty cuts of meat. Ribs, chicken wings, bacon, sausage, bologna, salami, chitterlings, fatback, hot dogs, bratwurst, and packaged luncheon meats. Salted nuts and seeds. Canned beans with salt. °Dairy °Whole or 2% milk, cream, half-and-half, and cream cheese. Whole-fat or sweetened yogurt. Full-fat   cheeses or blue cheese. Nondairy creamers and whipped toppings. Processed cheese, cheese spreads, or cheese  curds. °Condiments °Onion and garlic salt, seasoned salt, table salt, and sea salt. Canned and packaged gravies. Worcestershire sauce. Tartar sauce. Barbecue sauce. Teriyaki sauce. Soy sauce, including reduced sodium. Steak sauce. Fish sauce. Oyster sauce. Cocktail sauce. Horseradish. Ketchup and mustard. Meat flavorings and tenderizers. Bouillon cubes. Hot sauce. Tabasco sauce. Marinades. Taco seasonings. Relishes. °Fats and Oils °Butter, stick margarine, lard, shortening, ghee, and bacon fat. Coconut, palm kernel, or palm oils. Regular salad dressings. °Other °Pickles and olives. Salted popcorn and pretzels. °The items listed above may not be a complete list of foods and beverages to avoid. Contact your dietitian for more information. °WHERE CAN I FIND MORE INFORMATION? °National Heart, Lung, and [employer] Institute: www.nhlbi.nih.gov/health/health-topics/topics/dash/ °Document Released: 03/28/2011 Document Revised: 08/23/2013 Document Reviewed: 02/10/2013 °ExitCare® Patient Information ©2015 [employer]. This information is not intended to replace advice given to you by your health care provider. Make sure you discuss any questions you have with your health care provider. ° °

## 2014-07-14 NOTE — Progress Notes (Signed)
Patient presents for BP check Med list reviewed; states taking all meds as directed Following low sodium diet and using Mrs. Dash as alternative to salt Encouraged to choose foods with 5% or less of daily value for sodium. Patient walking 10-20 minutes per day for exercise. Encouraged to increase to 30 minutes per day Patient denies headaches, blurred vision, SHOB, chest pain or pressure Denies dizziness  BP 130/80  left arm manually with large cuff P 102 R 18  T  98.8 oral SPO2  94%  Patient advised to call for med refills at least 7 days before running out so as not to go without.  Patient aware that she is to f/u with PCP 3 months from last visit (Due 09/15/14)  Patient given literature on DASH Eating Plan

## 2014-11-01 ENCOUNTER — Ambulatory Visit (HOSPITAL_COMMUNITY)
Admission: RE | Admit: 2014-11-01 | Discharge: 2014-11-01 | Disposition: A | Discharge status: Home / Self Care | Payer: Self-pay | Source: Ambulatory Visit | Attending: Physician Assistant | Admitting: Physician Assistant

## 2014-11-01 ENCOUNTER — Ambulatory Visit: Payer: Self-pay | Attending: Internal Medicine | Admitting: Physician Assistant

## 2014-11-01 VITALS — BP 130/85 | HR 100 | Temp 98.2°F | Resp 16 | Wt 208.0 lb

## 2014-11-01 DIAGNOSIS — M25572 Pain in left ankle and joints of left foot: Secondary | ICD-10-CM

## 2014-11-01 MED ORDER — TRAMADOL HCL 50 MG PO TABS: 50.0000 mg | ORAL_TABLET | Freq: Three times a day (TID) | ORAL | Status: DC | PRN

## 2014-11-01 NOTE — Progress Notes (Signed)
Chief Complaint: Left foot injury  Subjective: This is a 44 year old female who 2 nights ago was getting out of her truck and felt her ankle pop. She's had a lot of pain and swelling in the left lower extremity. She can barely bear weight. She's not taking anything for pain or for swelling. She's been trying to stay off of the ankle.   ROS:  GEN: denies fever or chills, denies change in weight Skin: denies lesions or rashes EXT: denies muscle spasms +swelling; + pain in lower ext, no weakness NEURO: denies numbness or tingling, denies sz, stroke or TIA   Objective:  Filed Vitals:   11/01/14 1441  BP: 130/85  Pulse: 100  Temp: 98.2 F (36.8 C)  TempSrc: Oral  Resp: 16  Weight: 208 lb (94.348 kg)  SpO2: 96%    Physical Exam:  General: in no acute distress. HEENT: no pallor, no icterus, moist oral mucosa, no JVD, no lymphadenopathy Extremities: No clubbing cyanosis + edema with positive pedal pulses. Decreased ROM. Neuro: Alert, awake, oriented x3, nonfocal.   Medications: Prior to Admission medications   Medication Sig Start Date End Date Taking? Authorizing Provider  amLODipine (NORVASC) 10 MG tablet Take 1 tablet (10 mg total) by mouth daily. Patient not taking: Reported on 11/01/2014 06/07/14   Geoffery Lyonsouglas Delo, MD  bacitracin 500 UNIT/GM ointment Apply 1 application topically 2 (two) times daily. For insect bites on arm Patient not taking: Reported on 06/07/2014 09/30/13   Ambrose FinlandValerie A Keck, NP  ferrous sulfate 325 (65 FE) MG EC tablet Take 1 tablet (325 mg total) by mouth 3 (three) times daily with meals. Patient not taking: Reported on 11/01/2014 10/04/13   Ambrose FinlandValerie A Keck, NP  hydrochlorothiazide (HYDRODIURIL) 25 MG tablet Take 1 tablet (25 mg total) by mouth daily. Patient not taking: Reported on 11/01/2014 07/07/14   Quentin Angstlugbemiga E Jegede, MD  traMADol (ULTRAM) 50 MG tablet Take 1 tablet (50 mg total) by mouth every 8 (eight) hours as needed. 11/01/14   Vivianne Masteriffany S Madoline Bhatt, PA-C     Assessment/Plan: 1. Left foot injury/pain/swelling -xray left foot -Tramadol for pain -Immobilization --if abnormalities on xray, refer to ortho   Follow up:as scheduled  The patient was given clear instructions to go to ER or return to medical center if symptoms don't improve, worsen or new problems develop. The patient verbalized understanding. The patient was told to call to get lab results if they haven't heard anything in the next week.   This note has been created with Education officer, environmentalDragon speech recognition software and smart phrase technology. Any transcriptional errors are unintentional.   Scot Juniffany Dalisa Forrer, PA-C 11/01/2014, 3:55 PM

## 2014-11-01 NOTE — Progress Notes (Signed)
Pt present today for left ankle/ foot pain for a couple of days now.

## 2014-11-02 ENCOUNTER — Emergency Department (HOSPITAL_COMMUNITY)
Admission: EM | Admit: 2014-11-02 | Discharge: 2014-11-03 | Disposition: A | Discharge status: Home / Self Care | Payer: Self-pay | Attending: Emergency Medicine | Admitting: Emergency Medicine

## 2014-11-02 ENCOUNTER — Encounter (HOSPITAL_COMMUNITY): Payer: Self-pay | Admitting: Emergency Medicine

## 2014-11-02 DIAGNOSIS — Y9389 Activity, other specified: Secondary | ICD-10-CM | POA: Insufficient documentation

## 2014-11-02 DIAGNOSIS — Y92410 Unspecified street and highway as the place of occurrence of the external cause: Secondary | ICD-10-CM | POA: Insufficient documentation

## 2014-11-02 DIAGNOSIS — I10 Essential (primary) hypertension: Secondary | ICD-10-CM | POA: Insufficient documentation

## 2014-11-02 DIAGNOSIS — Y998 Other external cause status: Secondary | ICD-10-CM | POA: Insufficient documentation

## 2014-11-02 DIAGNOSIS — S93402A Sprain of unspecified ligament of left ankle, initial encounter: Secondary | ICD-10-CM | POA: Insufficient documentation

## 2014-11-02 DIAGNOSIS — Z8669 Personal history of other diseases of the nervous system and sense organs: Secondary | ICD-10-CM | POA: Insufficient documentation

## 2014-11-02 DIAGNOSIS — Z79899 Other long term (current) drug therapy: Secondary | ICD-10-CM | POA: Insufficient documentation

## 2014-11-02 MED ORDER — HYDROCODONE-ACETAMINOPHEN 5-325 MG PO TABS
1.0000 | ORAL_TABLET | Freq: Once | ORAL | Status: AC
  Administered 2014-11-03: 1 via ORAL
  Filled 2014-11-02: qty 1

## 2014-11-02 NOTE — ED Notes (Signed)
Patient here with complaint of left ankle pain. States she got out of her truck Sunday night and felt a pop. Since that time the ankle has swollen and become painful. Hypertensive in triage, states PCP took her off of her blood pressure medicines yesterday. Was also seen by PCP for ankle injury and received x-ray here yesterday which showed no fracture.

## 2014-11-02 NOTE — ED Provider Notes (Signed)
CSN: [id number]     Arrival date & time 11/02/14  2140 History   First MD Initiated Contact with Patient 11/02/14 2335     Chief Complaint  Patient presents with  . Ankle Pain     (Consider location/radiation/quality/duration/timing/severity/associated sxs/prior Treatment) HPI Patient presents to the emergency department with a left ankle injury that occurred on Sunday.  The patient states she stepped out of her truck and when she stepped down she twisted her ankle.  Patient states since that time she has had throbbing and swelling to the ankle.  The patient denies numbness, weakness, dizziness, back pain, fever or syncope.  The patient states that she has not applied any treatment with ice to the ankle.  She saw her primary care doctor yesterday who did an x-ray.  The patient states the x-ray was negative, but she still has pain and swelling.  Movement and palpation make the pain worse . Past Medical History  Diagnosis Date  . Hypertension   . Cerebral palsy    Past Surgical History  Procedure Laterality Date  . Leg surgery Bilateral 1977, 1981, 1989    x 3  . Eye surgery Bilateral [id number]    x 2  . Cesarean section  2004  . Tubal ligation  2004   Family History  Problem Relation Age of Onset  . Diabetes Mother   . COPD Mother    History  Substance Use Topics  . Smoking status: Never Smoker   . Smokeless tobacco: Not on file  . Alcohol Use: No   OB History    No data available     Review of Systems  All other systems negative except as documented in the HPI. All pertinent positives and negatives as reviewed in the HPI.  Allergies  Sulfa antibiotics  Home Medications   Prior to Admission medications   Medication Sig Start Date End Date Taking? Authorizing Provider  ferrous sulfate 325 (65 FE) MG EC tablet Take 1 tablet (325 mg total) by mouth 3 (three) times daily with meals. 10/04/13  Yes Ambrose FinlandValerie A Keck, NP  traMADol (ULTRAM) 50 MG tablet Take 1 tablet (50 mg  total) by mouth every 8 (eight) hours as needed. Patient taking differently: Take 50 mg by mouth every 8 (eight) hours as needed for moderate pain.  11/01/14  Yes Tiffany Netta CedarsS Noel, PA-C  hydrochlorothiazide (HYDRODIURIL) 25 MG tablet Take 1 tablet (25 mg total) by mouth daily. Patient not taking: Reported on 11/01/2014 07/07/14   Quentin Angstlugbemiga E Jegede, MD   BP 190/111 mmHg  Pulse 106  Temp(Src) 97.9 F (36.6 C) (Oral)  Resp 20  SpO2 97%  LMP 10/10/2014 (Exact Date) Physical Exam  Constitutional: She is oriented to person, place, and time. She appears well-developed and well-nourished. No distress.  HENT:  Head: Normocephalic and atraumatic.  Eyes: Pupils are equal, round, and reactive to light.  Neck: Normal range of motion. Neck supple.  Pulmonary/Chest: Effort normal. No respiratory distress.  Musculoskeletal:       Left ankle: She exhibits decreased range of motion and swelling. She exhibits no ecchymosis, no deformity and normal pulse. Tenderness. Lateral malleolus and medial malleolus tenderness found. Achilles tendon normal.  Neurological: She is alert and oriented to person, place, and time. Coordination normal.  Skin: Skin is warm and dry. No erythema.  Psychiatric: She has a normal mood and affect. Her behavior is normal.  Nursing note and vitals reviewed.   ED Course  Procedures (including critical care time)  Labs Review Labs Reviewed - No data to display  Imaging Review Dg Ankle Complete Left  11/01/2014   CLINICAL DATA:  Injured 2 days prior while stepping out of truck. Pain laterally.  EXAM: LEFT ANKLE COMPLETE - 3+ VIEW  COMPARISON:  None.  FINDINGS: Frontal, oblique, and lateral views obtained. There is no acute fracture or joint effusion. The ankle mortise appears intact. 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 Calcification is noted medial and posterior to the distal tibia and fibula ; this calcification may represent a chronic thrombus  either in a venous structure or in the soft tissue.  IMPRESSION: Areas of osteoarthritic change.  No fracture.  Mortise intact.   Electronically Signed   By: Bretta Bang III M.D.   On: 11/01/2014 16:16    The patient will be referred to orthopedics.  Told to return here as needed.  Ice and elevate her foot.  Told to follow back up with her primary care doctor on her blood pressure medication  Charlestine Night, PA-C 11/03/14 0011  Charlestine Night, PA-C 11/03/14 [id number]  Gilda Crease, MD 11/03/14 0020

## 2014-11-03 MED ORDER — IBUPROFEN 800 MG PO TABS: 800.0000 mg | ORAL_TABLET | Freq: Three times a day (TID) | ORAL | Status: DC | PRN

## 2014-11-03 NOTE — ED Notes (Signed)
Cam walker and ice applied

## 2014-11-03 NOTE — Discharge Instructions (Signed)
Return here as needed.  Follow-up with orthopedic doctor and your primary care.  Ice and elevate the ankle

## 2014-11-03 NOTE — ED Notes (Signed)
Patient reports pain/swelling began Sunday after she stepped out of truck, and heard ankle "pop".

## 2014-11-16 ENCOUNTER — Ambulatory Visit: Payer: Self-pay | Attending: Internal Medicine | Admitting: Internal Medicine

## 2014-11-16 ENCOUNTER — Encounter: Payer: Self-pay | Admitting: Internal Medicine

## 2014-11-16 VITALS — BP 140/109 | HR 95 | Temp 98.1°F | Resp 18 | Ht 60.0 in | Wt 234.0 lb

## 2014-11-16 DIAGNOSIS — Z79899 Other long term (current) drug therapy: Secondary | ICD-10-CM | POA: Insufficient documentation

## 2014-11-16 DIAGNOSIS — I10 Essential (primary) hypertension: Secondary | ICD-10-CM | POA: Insufficient documentation

## 2014-11-16 DIAGNOSIS — G809 Cerebral palsy, unspecified: Secondary | ICD-10-CM | POA: Insufficient documentation

## 2014-11-16 DIAGNOSIS — S93402D Sprain of unspecified ligament of left ankle, subsequent encounter: Secondary | ICD-10-CM | POA: Insufficient documentation

## 2014-11-16 DIAGNOSIS — X58XXXD Exposure to other specified factors, subsequent encounter: Secondary | ICD-10-CM | POA: Insufficient documentation

## 2014-11-16 MED ORDER — IBUPROFEN 600 MG PO TABS: 600.0000 mg | ORAL_TABLET | Freq: Three times a day (TID) | ORAL | Status: DC | PRN

## 2014-11-16 MED ORDER — HYDROCHLOROTHIAZIDE 25 MG PO TABS: 25.0000 mg | ORAL_TABLET | Freq: Every day | ORAL | Status: DC

## 2014-11-16 NOTE — Progress Notes (Signed)
F/U ankle sprain  Stated BP 189/118 at the ER  Not taking BP medication  No Hx tobacco

## 2014-11-16 NOTE — Progress Notes (Signed)
Patient ID: Yvette Snyder, female   DOB: [birth year], 44 y.o.   MRN: [id number]  CC: elevated BP, ankle sprain   HPI: Yvette Snyder is a 44 y.o. female here today for a follow up visit.  Patient has past medical history of HTN and cerebral palsy.  Patient was seen in the ER on 7/13 for a left ankle sprain after steeping out of her truck. She had x-rays completed that were negative on 7/12. She later went to the ER and was told to elevate and ice her ankle. She reports that she was told to follow up with Orthopedics but has been unable to go due to lack of insurance.  She is also concerned about her elevated BP. She reports that she has been out of her BP medication for 3 months because she ran out of refills. She states that she has been unable to get in contact with me over the last 3 months for refills. She was told last week that her BP was normal and that she did not need need to go back on BP medication by a different provider.   Patient has No headache, No chest pain, No abdominal pain - No Nausea, No new weakness tingling or numbness, No Cough - SOB.  Allergies  Allergen Reactions  . Sulfa Antibiotics Anaphylaxis   Past Medical History  Diagnosis Date  . Hypertension   . Cerebral palsy    Current Outpatient Prescriptions on File Prior to Visit  Medication Sig Dispense Refill  . ferrous sulfate 325 (65 FE) MG EC tablet Take 1 tablet (325 mg total) by mouth 3 (three) times daily with meals. 90 tablet 0  . ibuprofen (ADVIL,MOTRIN) 800 MG tablet Take 1 tablet (800 mg total) by mouth every 8 (eight) hours as needed. 21 tablet 0  . traMADol (ULTRAM) 50 MG tablet Take 1 tablet (50 mg total) by mouth every 8 (eight) hours as needed. 30 tablet 0  . hydrochlorothiazide (HYDRODIURIL) 25 MG tablet Take 1 tablet (25 mg total) by mouth daily. (Patient not taking: Reported on 11/01/2014) 90 tablet 3   No current facility-administered medications on file prior to visit.   Family History  Problem  Relation Age of Onset  . Diabetes Mother   . COPD Mother    History   Social History  . Marital Status: Married    Spouse Name: N/A  . Number of Children: N/A  . Years of Education: N/A   Occupational History  . Not on file.   Social History Main Topics  . Smoking status: Never Smoker   . Smokeless tobacco: Not on file  . Alcohol Use: 0.0 oz/week    0 Standard drinks or equivalent per week  . Drug Use: No  . Sexual Activity: Yes    Birth Control/ Protection: Surgical   Other Topics Concern  . Not on file   Social History Narrative    Review of Systems  Respiratory: Negative.   Cardiovascular: Negative.   Musculoskeletal:       Left ankle swelling   Neurological: Negative for dizziness and headaches.  All other systems reviewed and are negative.    Objective:   Filed Vitals:   11/16/14 1441  BP: 140/109  Pulse: 95  Temp: 98.1 F (36.7 C)  Resp: 18    Physical Exam  Cardiovascular: Normal rate, regular rhythm and normal heart sounds.   Pulmonary/Chest: Effort normal and breath sounds normal.  Musculoskeletal:  Decreased ROM of right side--stiff d/t Cerebral  Palsy Left boot in place for stabilization of ankle   .  Lab Results  Component Value Date   WBC 10.8* 06/07/2014   HGB 10.7* 06/07/2014   HCT 35.2* 06/07/2014   MCV 75.2* 06/07/2014   PLT 295 06/07/2014   Lab Results  Component Value Date   CREATININE 0.58 06/07/2014   BUN 6 06/07/2014   NA 138 06/07/2014   K 3.6 06/07/2014   CL 105 06/07/2014   CO2 24 06/07/2014    Lab Results  Component Value Date   HGBA1C 5.90 06/17/2014   Lipid Panel  No results found for: CHOL, TRIG, HDL, CHOLHDL, VLDL, LDLCALC     Assessment and plan:   Yvette Snyder was seen today for follow-up and hypertension.  Diagnoses and all orders for this visit:  Essential hypertension Orders: -    Begin hydrochlorothiazide (HYDRODIURIL) 25 MG tablet; Take 1 tablet (25 mg total) by mouth daily. I will begin  patient back on HCTZ to lower her BP. She will come back in 2 weeks for a BP recheck with RN.  Will hold off on placing patient back on amlodipine for now.  Left ankle sprain, subsequent encounter Orders: -     ibuprofen (ADVIL,MOTRIN) 600 MG tablet; Take 1 tablet (600 mg total) by mouth every 8 (eight) hours as needed. Patient may use Ibuprofen TID for the next 5 days to see if this will improve pain and inflammation. If pain persist we can refer to Ortho after she applies for the hospital discount.  RICE as needed   Return in about 2 weeks (around 11/30/2014) for Nurse Visit-BP check and 3 mo PCP.      Ambrose Finland, NP-C [facility] [phone] 11/16/2014, 3:07 PM

## 2014-11-16 NOTE — Patient Instructions (Addendum)
Renew hospital discount and Ortho referral will be placed if needed

## 2014-12-15 ENCOUNTER — Ambulatory Visit: Payer: Self-pay | Admitting: Internal Medicine

## 2016-04-01 ENCOUNTER — Encounter (HOSPITAL_COMMUNITY): Payer: Self-pay | Admitting: *Deleted

## 2016-04-01 ENCOUNTER — Emergency Department (HOSPITAL_COMMUNITY): Payer: Self-pay

## 2016-04-01 ENCOUNTER — Emergency Department (HOSPITAL_COMMUNITY)
Admission: EM | Admit: 2016-04-01 | Discharge: 2016-04-01 | Disposition: A | Discharge status: Home / Self Care | Payer: Self-pay | Attending: Emergency Medicine | Admitting: Emergency Medicine

## 2016-04-01 DIAGNOSIS — M25511 Pain in right shoulder: Secondary | ICD-10-CM | POA: Insufficient documentation

## 2016-04-01 DIAGNOSIS — Z79899 Other long term (current) drug therapy: Secondary | ICD-10-CM | POA: Insufficient documentation

## 2016-04-01 DIAGNOSIS — I10 Essential (primary) hypertension: Secondary | ICD-10-CM | POA: Insufficient documentation

## 2016-04-01 LAB — COMPREHENSIVE METABOLIC PANEL
ALBUMIN: 3.9 g/dL (ref 3.5–5.0)
ALT: 18 U/L (ref 14–54)
ANION GAP: 12 (ref 5–15)
AST: 22 U/L (ref 15–41)
Alkaline Phosphatase: 79 U/L (ref 38–126)
BILIRUBIN TOTAL: 0.3 mg/dL (ref 0.3–1.2)
BUN: 10 mg/dL (ref 6–20)
CHLORIDE: 102 mmol/L (ref 101–111)
CO2: 23 mmol/L (ref 22–32)
Calcium: 9.2 mg/dL (ref 8.9–10.3)
Creatinine, Ser: 0.63 mg/dL (ref 0.44–1.00)
GFR calc Af Amer: >60 mL/min (ref >60)
GFR calc non Af Amer: >60 mL/min (ref >60)
GLUCOSE: 82 mg/dL (ref 65–99)
POTASSIUM: 4.5 mmol/L (ref 3.5–5.1)
SODIUM: 137 mmol/L (ref 135–145)
TOTAL PROTEIN: 7.2 g/dL (ref 6.5–8.1)

## 2016-04-01 LAB — I-STAT TROPONIN, ED: Troponin i, poc: 0 ng/mL (ref 0.00–0.08)

## 2016-04-01 LAB — CBC WITH DIFFERENTIAL/PLATELET
BASOS PCT: 0 %
Basophils Absolute: 0 10*3/uL (ref 0.0–0.1)
EOS ABS: 0.2 10*3/uL (ref 0.0–0.7)
Eosinophils Relative: 2 %
HEMATOCRIT: 43.1 % (ref 36.0–46.0)
Hemoglobin: 13.8 g/dL (ref 12.0–15.0)
Lymphocytes Relative: 28 %
Lymphs Abs: 3.2 10*3/uL (ref 0.7–4.0)
MCH: 26.8 pg (ref 26.0–34.0)
MCHC: 32 g/dL (ref 30.0–36.0)
MCV: 83.9 fL (ref 78.0–100.0)
MONO ABS: 0.5 10*3/uL (ref 0.1–1.0)
Monocytes Relative: 4 %
Neutro Abs: 7.7 10*3/uL (ref 1.7–7.7)
Neutrophils Relative %: 66 %
Platelets: 258 10*3/uL (ref 150–400)
RBC: 5.14 MIL/uL — ABNORMAL HIGH (ref 3.87–5.11)
RDW: 14.4 % (ref 11.5–15.5)
WBC: 11.6 10*3/uL — ABNORMAL HIGH (ref 4.0–10.5)

## 2016-04-01 MED ORDER — OXYCODONE-ACETAMINOPHEN 5-325 MG PO TABS
1.0000 | ORAL_TABLET | Freq: Once | ORAL | Status: AC
  Administered 2016-04-01: 1 via ORAL
  Filled 2016-04-01: qty 1

## 2016-04-01 MED ORDER — CYCLOBENZAPRINE HCL 10 MG PO TABS: 10.0000 mg | ORAL_TABLET | Freq: Two times a day (BID) | ORAL | 0 refills | Status: DC | PRN

## 2016-04-01 MED ORDER — CYCLOBENZAPRINE HCL 10 MG PO TABS
10.0000 mg | ORAL_TABLET | Freq: Once | ORAL | Status: AC
  Administered 2016-04-01: 10 mg via ORAL
  Filled 2016-04-01: qty 1

## 2016-04-01 MED ORDER — DICLOFENAC SODIUM 50 MG PO TBEC: 50.0000 mg | DELAYED_RELEASE_TABLET | Freq: Two times a day (BID) | ORAL | 0 refills | Status: DC

## 2016-04-01 NOTE — ED Notes (Signed)
Pt ambulatory out of ED with family. Pt states she will follow up with primary car about blood pressure medications

## 2016-04-01 NOTE — ED Triage Notes (Signed)
PT states R shoulder began hurting last night before she went to bed.  When she woke up this am it was difficult to move d/t pain.
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
## 2016-04-01 NOTE — ED Notes (Signed)
Called X-ray.  They stated 2 more pt's in front of pt.  Dr Ranae PalmsYelverton stated he will bring pt back next.

## 2016-04-01 NOTE — Discharge Instructions (Signed)
Do not drive while taking the muscle relaxant because it will make you sleepy. Follow up with Dr. August Saucerean if symptoms worsen.

## 2016-04-02 MED FILL — CYCLOBENZAPRINE 10 MG TAB: 10 | 10 days supply | Qty: 20 | Fill #0

## 2016-04-02 MED FILL — ?DICLOFENAC SOD DR 50 MG TA: 50 | 7 days supply | Qty: 15 | Fill #0

## 2016-04-24 ENCOUNTER — Ambulatory Visit: Payer: Self-pay | Attending: Internal Medicine | Admitting: Internal Medicine

## 2016-04-24 VITALS — BP 163/95 | HR 108 | Temp 98.0°F | Resp 22 | Ht 60.0 in | Wt 264.0 lb

## 2016-04-24 DIAGNOSIS — G809 Cerebral palsy, unspecified: Secondary | ICD-10-CM | POA: Insufficient documentation

## 2016-04-24 DIAGNOSIS — Z882 Allergy status to sulfonamides status: Secondary | ICD-10-CM | POA: Insufficient documentation

## 2016-04-24 DIAGNOSIS — Z79899 Other long term (current) drug therapy: Secondary | ICD-10-CM | POA: Insufficient documentation

## 2016-04-24 DIAGNOSIS — G4733 Obstructive sleep apnea (adult) (pediatric): Secondary | ICD-10-CM | POA: Insufficient documentation

## 2016-04-24 DIAGNOSIS — I10 Essential (primary) hypertension: Secondary | ICD-10-CM | POA: Insufficient documentation

## 2016-04-24 DIAGNOSIS — R7303 Prediabetes: Secondary | ICD-10-CM

## 2016-04-24 DIAGNOSIS — Z6841 Body Mass Index (BMI) 40.0 and over, adult: Secondary | ICD-10-CM | POA: Insufficient documentation

## 2016-04-24 LAB — POCT GLYCOSYLATED HEMOGLOBIN (HGB A1C): Hemoglobin A1C: 6.1

## 2016-04-24 MED ORDER — METOPROLOL SUCCINATE ER 25 MG PO TB24: 25.0000 mg | ORAL_TABLET | Freq: Every day | ORAL | 3 refills | Status: AC

## 2016-04-24 MED FILL — METOPROLOL SUCC ER 25 MG TA: 25 | 30 days supply | Qty: 30 | Fill #0

## 2016-04-24 NOTE — Patient Instructions (Signed)
DASH Eating Plan DASH stands for "Dietary Approaches to Stop Hypertension." The DASH eating plan is a healthy eating plan that has been shown to reduce high blood pressure (hypertension). Additional health benefits may include reducing the risk of type 2 diabetes mellitus, heart disease, and stroke. The DASH eating plan may also help with weight loss. What do I need to know about the DASH eating plan? For the DASH eating plan, you will follow these general guidelines:  Choose foods with less than 150 milligrams of sodium per serving (as listed on the food label).  Use salt-free seasonings or herbs instead of table salt or sea salt.  Check with your health care provider or pharmacist before using salt substitutes.  Eat lower-sodium products. These are often labeled as "low-sodium" or "no salt added."  Eat fresh foods. Avoid eating a lot of canned foods.  Eat more vegetables, fruits, and low-fat dairy products.  Choose whole grains. Look for the word "whole" as the first word in the ingredient list.  Choose fish and skinless chicken or turkey more often than red meat. Limit fish, poultry, and meat to 6 oz (170 g) each day.  Limit sweets, desserts, sugars, and sugary drinks.  Choose heart-healthy fats.  Eat more home-cooked food and less restaurant, buffet, and fast food.  Limit fried foods.  Do not fry foods. Cook foods using methods such as baking, boiling, grilling, and broiling instead.  When eating at a restaurant, ask that your food be prepared with less salt, or no salt if possible. What foods can I eat? Seek help from a dietitian for individual calorie needs. Grains  Whole grain or whole wheat bread. Brown rice. Whole grain or whole wheat pasta. Quinoa, bulgur, and whole grain cereals. Low-sodium cereals. Corn or whole wheat flour tortillas. Whole grain cornbread. Whole grain crackers. Low-sodium crackers. Vegetables  Fresh or frozen vegetables (raw, steamed, roasted, or  grilled). Low-sodium or reduced-sodium tomato and vegetable juices. Low-sodium or reduced-sodium tomato sauce and paste. Low-sodium or reduced-sodium canned vegetables. Fruits  All fresh, canned (in natural juice), or frozen fruits. Meat and Other Protein Products  Ground beef (85% or leaner), grass-fed beef, or beef trimmed of fat. Skinless chicken or turkey. Ground chicken or turkey. Pork trimmed of fat. All fish and seafood. Eggs. Dried beans, peas, or lentils. Unsalted nuts and seeds. Unsalted canned beans. Dairy  Low-fat dairy products, such as skim or 1% milk, 2% or reduced-fat cheeses, low-fat ricotta or cottage cheese, or plain low-fat yogurt. Low-sodium or reduced-sodium cheeses. Fats and Oils  Tub margarines without trans fats. Light or reduced-fat mayonnaise and salad dressings (reduced sodium). Avocado. Safflower, olive, or canola oils. Natural peanut or almond butter. Other  Unsalted popcorn and pretzels. The items listed above may not be a complete list of recommended foods or beverages. Contact your dietitian for more options.  What foods are not recommended? Grains  White bread. White pasta. White rice. Refined cornbread. Bagels and croissants. Crackers that contain trans fat. Vegetables  Creamed or fried vegetables. Vegetables in a cheese sauce. Regular canned vegetables. Regular canned tomato sauce and paste. Regular tomato and vegetable juices. Fruits  Canned fruit in light or heavy syrup. Fruit juice. Meat and Other Protein Products  Fatty cuts of meat. Ribs, chicken wings, bacon, sausage, bologna, salami, chitterlings, fatback, hot dogs, bratwurst, and packaged luncheon meats. Salted nuts and seeds. Canned beans with salt. Dairy  Whole or 2% milk, cream, half-and-half, and cream cheese. Whole-fat or sweetened yogurt. Full-fat cheeses   or blue cheese. Nondairy creamers and whipped toppings. Processed cheese, cheese spreads, or cheese curds. Condiments  Onion and garlic  salt, seasoned salt, table salt, and sea salt. Canned and packaged gravies. Worcestershire sauce. Tartar sauce. Barbecue sauce. Teriyaki sauce. Soy sauce, including reduced sodium. Steak sauce. Fish sauce. Oyster sauce. Cocktail sauce. Horseradish. Ketchup and mustard. Meat flavorings and tenderizers. Bouillon cubes. Hot sauce. Tabasco sauce. Marinades. Taco seasonings. Relishes. Fats and Oils  Butter, stick margarine, lard, shortening, ghee, and bacon fat. Coconut, palm kernel, or palm oils. Regular salad dressings. Other  Pickles and olives. Salted popcorn and pretzels. The items listed above may not be a complete list of foods and beverages to avoid. Contact your dietitian for more information.  Where can I find more information? National Heart, Lung, and [employer] Institute: www.nhlbi.nih.gov/health/health-topics/topics/dash/ This information is not intended to replace advice given to you by your health care provider. Make sure you discuss any questions you have with your health care provider. Document Released: 03/28/2011 Document Revised: 09/14/2015 Document Reviewed: 02/10/2013 Elsevier Interactive Patient Education  2017 Elsevier Inc. Hypertension Hypertension, commonly called high blood pressure, is when the force of blood pumping through your arteries is too strong. Your arteries are the blood vessels that carry blood from your heart throughout your body. A blood pressure reading consists of a higher number over a lower number, such as 110/72. The higher number (systolic) is the pressure inside your arteries when your heart pumps. The lower number (diastolic) is the pressure inside your arteries when your heart relaxes. Ideally you want your blood pressure below 120/80. Hypertension forces your heart to work harder to pump blood. Your arteries may become narrow or stiff. Having untreated or uncontrolled hypertension can cause heart attack, stroke, kidney disease, and other problems. What  increases the risk? Some risk factors for high blood pressure are controllable. Others are not. Risk factors you cannot control include:  Race. You may be at higher risk if you are African American.  Age. Risk increases with age.  Gender. Men are at higher risk than women before age 45 years. After age 65, women are at higher risk than men. Risk factors you can control include:  Not getting enough exercise or physical activity.  Being overweight.  Getting too much fat, sugar, calories, or salt in your diet.  Drinking too much alcohol. What are the signs or symptoms? Hypertension does not usually cause signs or symptoms. Extremely high blood pressure (hypertensive crisis) may cause headache, anxiety, shortness of breath, and nosebleed. How is this diagnosed? To check if you have hypertension, your health care provider will measure your blood pressure while you are seated, with your arm held at the level of your heart. It should be measured at least twice using the same arm. Certain conditions can cause a difference in blood pressure between your right and left arms. A blood pressure reading that is higher than normal on one occasion does not mean that you need treatment. If it is not clear whether you have high blood pressure, you may be asked to return on a different day to have your blood pressure checked again. Or, you may be asked to monitor your blood pressure at home for 1 or more weeks. How is this treated? Treating high blood pressure includes making lifestyle changes and possibly taking medicine. Living a healthy lifestyle can help lower high blood pressure. You may need to change some of your habits. Lifestyle changes may include:  Following the DASH diet. This   diet is high in fruits, vegetables, and whole grains. It is low in salt, red meat, and added sugars.  Keep your sodium intake below 2,300 mg per day.  Getting at least 30-45 minutes of aerobic exercise at least 4 times  per week.  Losing weight if necessary.  Not smoking.  Limiting alcoholic beverages.  Learning ways to reduce stress. Your health care provider may prescribe medicine if lifestyle changes are not enough to get your blood pressure under control, and if one of the following is true:  You are 18-59 years of age and your systolic blood pressure is above 140.  You are 60 years of age or older, and your systolic blood pressure is above 150.  Your diastolic blood pressure is above 90.  You have diabetes, and your systolic blood pressure is over 140 or your diastolic blood pressure is over 90.  You have kidney disease and your blood pressure is above 140/90.  You have heart disease and your blood pressure is above 140/90. Your personal target blood pressure may vary depending on your medical conditions, your age, and other factors. Follow these instructions at home:  Have your blood pressure rechecked as directed by your health care provider.  Take medicines only as directed by your health care provider. Follow the directions carefully. Blood pressure medicines must be taken as prescribed. The medicine does not work as well when you skip doses. Skipping doses also puts you at risk for problems.  Do not smoke.  Monitor your blood pressure at home as directed by your health care provider. Contact a health care provider if:  You think you are having a reaction to medicines taken.  You have recurrent headaches or feel dizzy.  You have swelling in your ankles.  You have trouble with your vision. Get help right away if:  You develop a severe headache or confusion.  You have unusual weakness, numbness, or feel faint.  You have severe chest or abdominal pain.  You vomit repeatedly.  You have trouble breathing. This information is not intended to replace advice given to you by your health care provider. Make sure you discuss any questions you have with your health care  provider. Document Released: 04/08/2005 Document Revised: 09/14/2015 Document Reviewed: 01/29/2013 Elsevier Interactive Patient Education  2017 Elsevier Inc.  

## 2016-04-24 NOTE — Progress Notes (Signed)
Yvette Snyder, is a 46 y.o. female  XWR:[id number]  [id number]  DOB - 1970/10/24  Chief Complaint  Patient presents with  . Establish Care  . Hypertension      Subjective:   Yvette Snyder is a 46 y.o. female with medical history of HTN and cerebral palsy here today for a follow up visit after being lost to follow up for over a year. She has no major complaint today, she needs refill of her blood pressure medication. She snores loudly and may have sleep apnea but has never had a sleep study. She is obese, and she is interested in any form of nutritional class or diet pill. She lives at home with her husband and daughter, feels safe at home. She denies being depressed, denies any suicidal ideation or thought. Patient has No headache, No chest pain, No abdominal pain - No Nausea, No new weakness tingling or numbness, No Cough - SOB.  Problem  Morbid Obesity (Hcc)  Osa (Obstructive Sleep Apnea)  Essential Hypertension   ALLERGIES: Allergies  Allergen Reactions  . Sulfa Antibiotics Anaphylaxis   PAST MEDICAL HISTORY: Past Medical History:  Diagnosis Date  . Cerebral palsy (HCC)   . Hypertension    MEDICATIONS AT HOME: Prior to Admission medications   Medication Sig Start Date End Date Taking? Authorizing Provider  cyclobenzaprine (FLEXERIL) 10 MG tablet Take 1 tablet (10 mg total) by mouth 2 (two) times daily as needed for muscle spasms. 04/01/16  Yes Hope Orlene OchM Neese, NP  diclofenac (VOLTAREN) 50 MG EC tablet Take 1 tablet (50 mg total) by mouth 2 (two) times daily. 04/01/16  Yes Hope Orlene OchM Neese, NP  ferrous sulfate 325 (65 FE) MG EC tablet Take 1 tablet (325 mg total) by mouth 3 (three) times daily with meals. Patient not taking: Reported on 04/24/2016 10/04/13   Ambrose FinlandValerie A Keck, NP  metoprolol succinate (TOPROL-XL) 25 MG 24 hr tablet Take 1 tablet (25 mg total) by mouth daily. 04/24/16   Quentin Angstlugbemiga E Ritaj Dullea, MD   Objective:   Vitals:   04/24/16 1422  BP: (!) 163/95  Pulse: (!)  108  Resp: (!) 22  Temp: 98 F (36.7 C)  TempSrc: Oral  SpO2: 96%  Weight: 264 lb (119.7 kg)  Height: 5' (1.524 m)   Exam General appearance : Awake, alert, not in any distress. Speech Clear. Not toxic looking, morbidly obese HEENT: Atraumatic and Normocephalic, pupils equally reactive to light and accomodation Neck: Supple, no JVD. No cervical lymphadenopathy.  Chest: Good air entry bilaterally, no added sounds  CVS: S1 S2 regular, no murmurs.  Abdomen: Bowel sounds present, Non tender and not distended with no gaurding, rigidity or rebound. Extremities: B/L Lower Ext shows no edema, both legs are warm to touch Neurology: Awake alert, and oriented X 3, CN II-XII intact, Non focal Skin: No Rash  Data Review Lab Results  Component Value Date   HGBA1C 5.90 06/17/2014   HGBA1C 6.0 (H) 09/30/2013    Assessment & Plan   1. Essential hypertension  - metoprolol succinate (TOPROL-XL) 25 MG 24 hr tablet; Take 1 tablet (25 mg total) by mouth daily.  Dispense: 60 tablet; Refill: 3  We have discussed target BP range and blood pressure goal. I have advised patient to check BP regularly and to call us back or report to clinic if the numbers are consistently higher than 140/90. We discussed the importance of compliance with medical therapy and DASH diet recommended, consequences of uncontrolled hypertension discussed.  -  continue current BP medications  2. Morbid obesity (HCC)  - Amb ref to Medical Nutrition Therapy-MNT  3. OSA (obstructive sleep apnea)  - Split night study; Future  Patient have been counseled extensively about nutrition and exercise. Other issues discussed during this visit include: low cholesterol diet, weight control and daily exercise, importance of adherence with medications and regular follow-up. We also discussed long term complications of uncontrolled hypertension.   Return in about 3 months (around 07/23/2016) for Follow up HTN, Obesity.  The patient was given  clear instructions to go to ER or return to medical center if symptoms don't improve, worsen or new problems develop. The patient verbalized understanding. The patient was told to call to get lab results if they haven't heard anything in the next week.   This note has been created with Education officer, environmental. Any transcriptional errors are unintentional.    Jeanann Lewandowsky, MD, MHA, FACP, FAAP, CPE [facility] [place], [place] [phone]   04/24/2016, 2:45 PM

## 2016-04-24 NOTE — Progress Notes (Signed)
Patient is here to establish care  Patient has not taken medication today. Patient has eaten today.  Patient declined the flu vaccine.  Patient denies any HA, blurred vision being present. Patient has not taken HCTZ in over a year.

## 2016-05-27 MED FILL — METOPROLOL SUCC ER 25 MG TA: 25 | 30 days supply | Qty: 30 | Fill #1

## 2016-08-17 ENCOUNTER — Emergency Department (HOSPITAL_COMMUNITY)
Admission: EM | Admit: 2016-08-17 | Discharge: 2016-08-18 | Disposition: A | Discharge status: Home / Self Care | Payer: Medicare Other | Attending: Emergency Medicine | Admitting: Emergency Medicine

## 2016-08-17 ENCOUNTER — Encounter (HOSPITAL_COMMUNITY): Payer: Self-pay

## 2016-08-17 DIAGNOSIS — H5789 Other specified disorders of eye and adnexa: Secondary | ICD-10-CM

## 2016-08-17 DIAGNOSIS — H578 Other specified disorders of eye and adnexa: Secondary | ICD-10-CM | POA: Insufficient documentation

## 2016-08-17 DIAGNOSIS — Z79899 Other long term (current) drug therapy: Secondary | ICD-10-CM | POA: Insufficient documentation

## 2016-08-17 DIAGNOSIS — I10 Essential (primary) hypertension: Secondary | ICD-10-CM | POA: Insufficient documentation

## 2016-08-17 MED ORDER — FLUORESCEIN SODIUM 0.6 MG OP STRP
1.0000 | ORAL_STRIP | Freq: Once | OPHTHALMIC | Status: AC
  Administered 2016-08-17: 1 via OPHTHALMIC
  Filled 2016-08-17: qty 1

## 2016-08-17 MED ORDER — TETRACAINE HCL 0.5 % OP SOLN
2.0000 [drp] | Freq: Once | OPHTHALMIC | Status: AC
  Administered 2016-08-17: 2 [drp] via OPHTHALMIC
  Filled 2016-08-17: qty 2

## 2016-08-17 NOTE — ED Triage Notes (Signed)
Pt here for right eye injury after getting shampoo in eye this am at 1100, sts blurred vision with drainage noted to right eye with pain

## 2016-08-17 NOTE — ED Provider Notes (Signed)
[facility]-EMERGENCY DEPT Provider Note   CSN: [id number] Arrival date & time: 08/17/16  2201 By signing my name below, I, Drue Flirt, attest that this documentation has been prepared under the direction and in the presence of non physician provider, Melburn Hake, PA-C Electronically Signed: Drue Flirt, Scribe. 08/18/2016. 12:00 AM   History   Chief Complaint Chief Complaint  Patient presents with  . Eye Pain   HPI Yvette Snyder is a 46 y.o. female who presents to the Emergency Department complaining of sudden onset, constant left eye pain after getting shampoo in her eye this morning. Per pt, her shampoo contains tea tree oil which she believes caused the irritation. She reports associated burning to her left eye and watery discharge. Pt applied warm compress to area with no significant relief and reports yellow discharge from her eye this afternoon/evening. She denies any visual disturbance, photophobia, errythemia, fever, rhinorrhea. Pt does not wear glasses or contacts.   The history is provided by the patient. No language interpreter was used.   Past Medical History:  Diagnosis Date  . Cerebral palsy (HCC)   . Hypertension    Patient Active Problem List   Diagnosis Date Noted  . Morbid obesity (HCC) 04/24/2016  . OSA (obstructive sleep apnea) 04/24/2016  . Preventative health care 02/21/2014  . Amenorrhea 02/21/2014  . Essential hypertension 01/11/2014   Past Surgical History:  Procedure Laterality Date  . CESAREAN SECTION  2004  . EYE SURGERY Bilateral 5621,3086   x 2  . LEG SURGERY Bilateral 1977, 1981, 1989   x 3  . TUBAL LIGATION  2004   OB History    No data available     Home Medications    Prior to Admission medications   Medication Sig Start Date End Date Taking? Authorizing Provider  cyclobenzaprine (FLEXERIL) 10 MG tablet Take 1 tablet (10 mg total) by mouth 2 (two) times daily as needed for muscle spasms. 04/01/16   Hope Orlene Och, NP  diclofenac  (VOLTAREN) 50 MG EC tablet Take 1 tablet (50 mg total) by mouth 2 (two) times daily. 04/01/16   Hope Orlene Och, NP  ferrous sulfate 325 (65 FE) MG EC tablet Take 1 tablet (325 mg total) by mouth 3 (three) times daily with meals. Patient not taking: Reported on 04/24/2016 10/04/13   Ambrose Finland, NP  metoprolol succinate (TOPROL-XL) 25 MG 24 hr tablet Take 1 tablet (25 mg total) by mouth daily. 04/24/16   Quentin Angst, MD   Family History Family History  Problem Relation Age of Onset  . Diabetes Mother   . COPD Mother    Social History Social History  Substance Use Topics  . Smoking status: Never Smoker  . Smokeless tobacco: Not on file  . Alcohol use 0.0 oz/week   Allergies   Sulfa antibiotics Review of Systems Review of Systems  Constitutional: Negative for fever.  HENT: Negative for rhinorrhea.   Eyes: Positive for photophobia, pain and discharge. Negative for redness and visual disturbance.   Physical Exam Updated Vital Signs BP (!) 176/112 (BP Location: Left Arm)   Pulse (!) 108   Temp 98.2 F (36.8 C) (Oral)   Resp 18   Wt 116.1 kg   LMP 04/02/2015   SpO2 94%   BMI 50.00 kg/m   Physical Exam  Constitutional: She is oriented to person, place, and time. She appears well-developed and well-nourished.  HENT:  Head: Normocephalic and atraumatic.  Mouth/Throat: Oropharynx is clear and moist. No  oropharyngeal exudate.  Eyes: EOM and lids are normal. Pupils are equal, round, and reactive to light. Lids are everted and swept, no foreign bodies found. Right eye exhibits no chemosis, no discharge, no exudate and no hordeolum. No foreign body present in the right eye. Left eye exhibits no chemosis, no discharge, no exudate and no hordeolum. No foreign body present in the left eye. Right conjunctiva is injected. Right conjunctiva has no hemorrhage. Left conjunctiva is not injected. Left conjunctiva has no hemorrhage. No scleral icterus.  Slit lamp exam:      The right eye  shows no corneal abrasion, no corneal flare, no corneal ulcer, no foreign body, no hyphema and no fluorescein uptake.  Right eye pH 8  Neck: Normal range of motion. Neck supple.  Cardiovascular: Normal rate and intact distal pulses.   HR 94  Pulmonary/Chest: Effort normal.  Neurological: She is alert and oriented to person, place, and time.  Skin: Skin is warm and dry.  Nursing note and vitals reviewed.  ED Treatments / Results  DIAGNOSTIC STUDIES: Oxygen Saturation is 94% on RA, adequate by my interpretation.    COORDINATION OF CARE: 11:20 PM Discussed treatment plan with pt at bedside and pt agreed to plan.  Labs (all labs ordered are listed, but only abnormal results are displayed) Labs Reviewed - No data to display  EKG  EKG Interpretation None      Radiology No results found. Procedures Procedures (including critical care time) Medications Ordered in ED Medications  fluorescein ophthalmic strip 1 strip (1 strip Right Eye Given 08/17/16 2337)  tetracaine (PONTOCAINE) 0.5 % ophthalmic solution 2 drop (2 drops Right Eye Given 08/17/16 2336)   Initial Impression / Assessment and Plan / ED Course  I have reviewed the triage vital signs and the nursing notes.  Pertinent labs & imaging results that were available during my care of the patient were reviewed by me and considered in my medical decision making (see chart for details).     Pt presents with right eye irritation, redness and watering after getting shampoo in her eye this morning. Denies use of contact lens. VSS. Exam revealed mild injection of right conjunctiva, no foreign body present. No purulent discharge, corneal abrasions, entrapment, consensual photophobia, or dendritic staining with fluorescein study.  Presentation non-concerning for iritis, bacterial conjunctivitis, corneal abrasions, or HSV.  Right eye pH 8. Right eye irrigated with 2L NS using morgan lens in the ED. Repeat WNL (pH 7.5). Plan to d/c pt home  with symptomatic tx and outpatient ophthalmology f/u as needed. Discussed return precautions.   Final Clinical Impressions(s) / ED Diagnoses   Final diagnoses:  Eye irritation   New Prescriptions New Prescriptions   No medications on file   I personally performed the services described in this documentation, which was scribed in my presence. The recorded information has been reviewed and is accurate.     Satira Sark [place], [place] 08/18/16 0150    Mancel Bale, MD 08/18/16 1227

## 2016-08-17 NOTE — ED Notes (Signed)
Pt complains of pain, redness, and pus to her right eye after getting shampoo in it this morning. Pt reports no vision loss. Pt also reports she should be wearing glasses but does not have them.

## 2016-08-18 NOTE — Discharge Instructions (Signed)
You may apply over the counter saline eye drops as needed for relief.  Follow up with the eye doctor listed below as needed. Please return to the Emergency Department if symptoms worsen or new onset of fever, facial swelling, visual changes, eye drainage, loss of vision.

## 2017-11-24 IMAGING — CR DG SHOULDER 2+V*R*
2 series · 2 of 2 positions shown · non-contrast
Comparison: None.

CLINICAL DATA: Right shoulder pain, no known injury, initial
encounter

EXAM:
RIGHT SHOULDER - 2+ VIEW

[shoulder grashey]
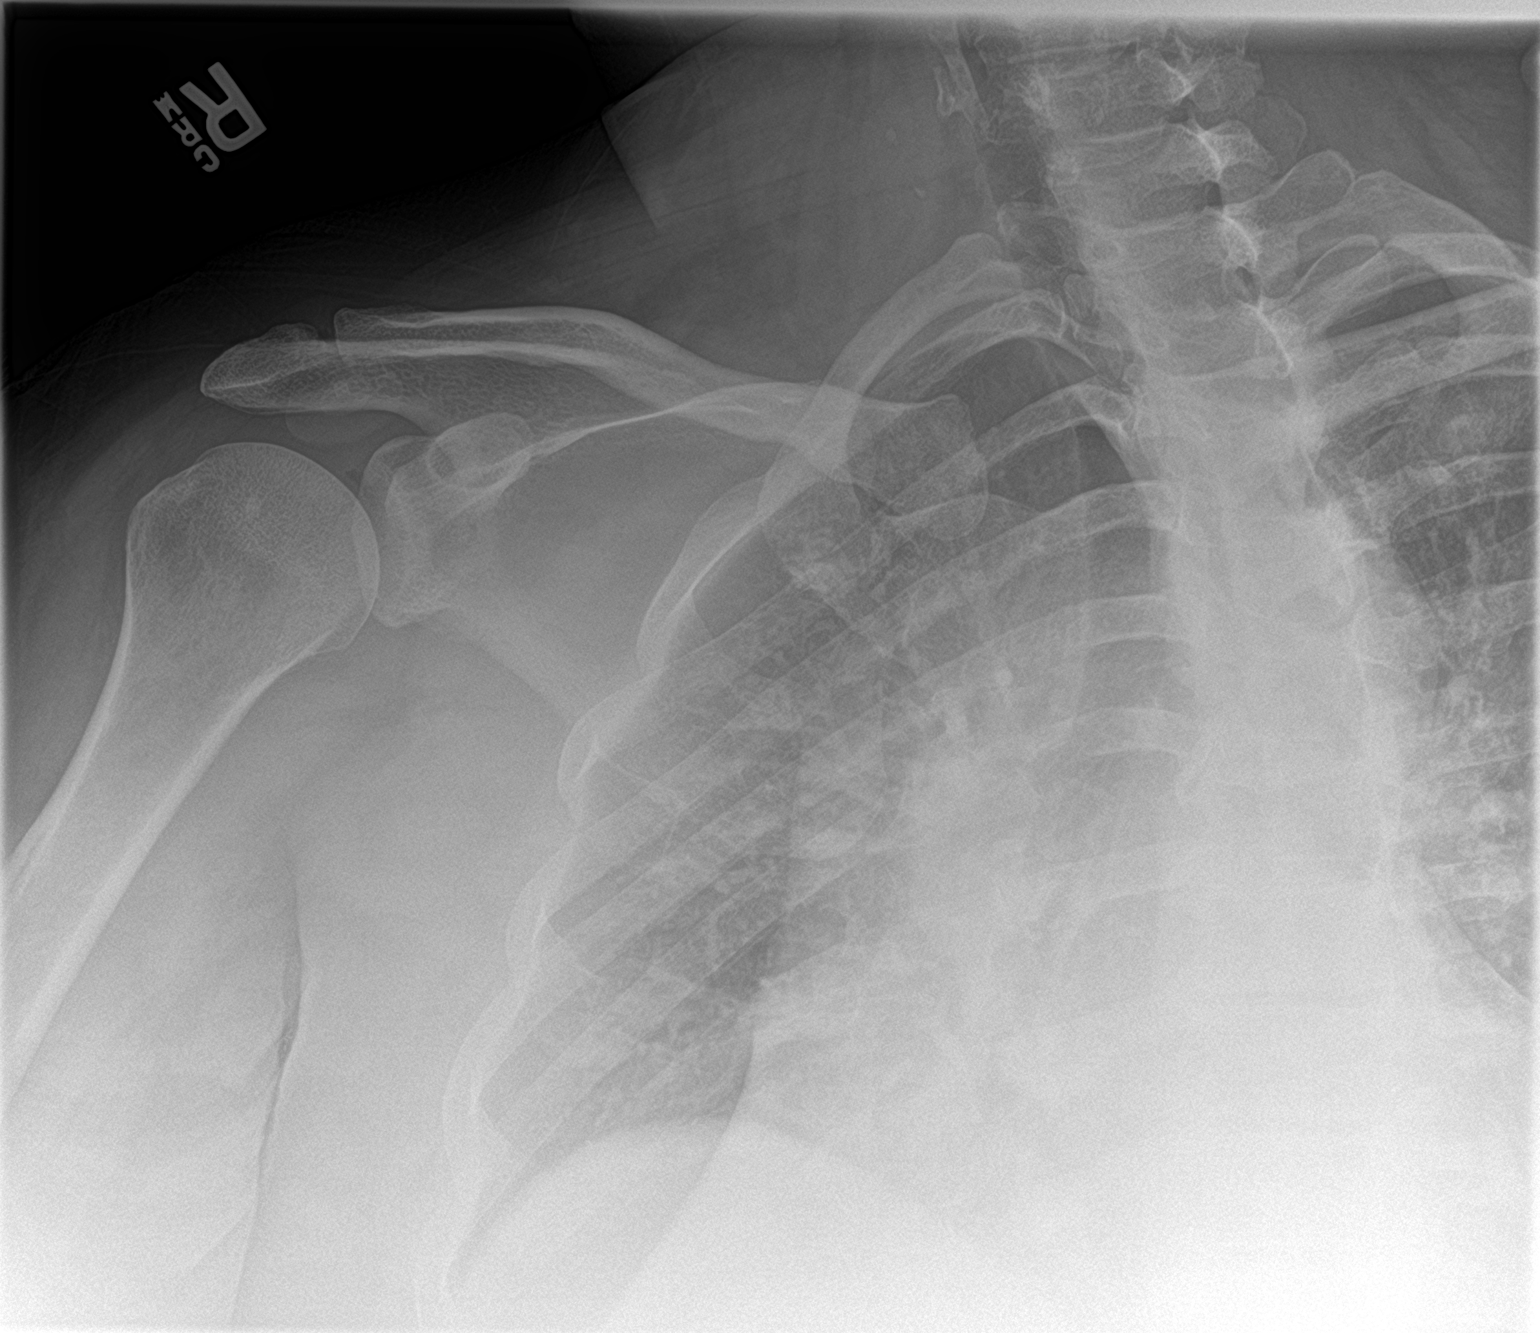

[shoulder y view]
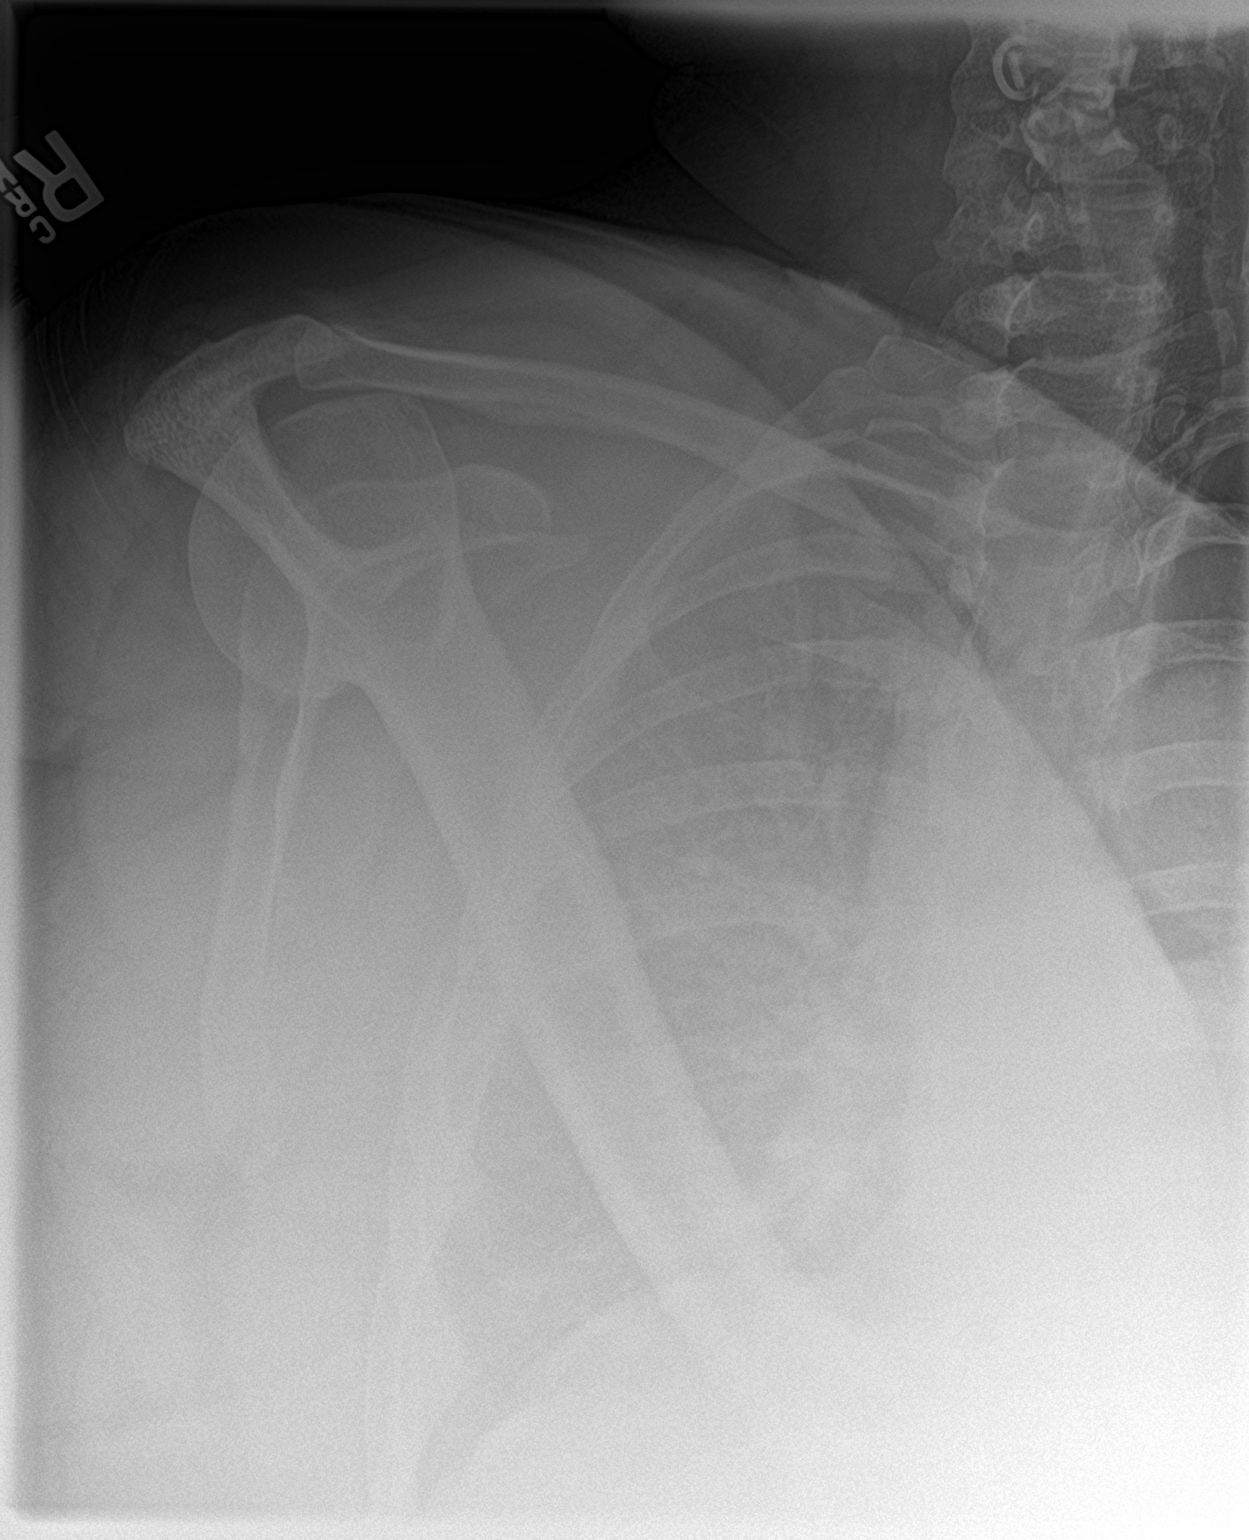

[2 of 2 positions shown; findings below may reference images not displayed]

FINDINGS: There is no evidence of fracture or dislocation. There is no
evidence of arthropathy or other focal bone abnormality. Soft
tissues are unremarkable.
IMPRESSION: No acute abnormality noted.

## 2021-01-02 DIAGNOSIS — Z0000 Encounter for general adult medical examination without abnormal findings: Secondary | ICD-10-CM | POA: Diagnosis not present

## 2021-01-02 DIAGNOSIS — K029 Dental caries, unspecified: Secondary | ICD-10-CM | POA: Diagnosis not present

## 2021-01-02 DIAGNOSIS — R269 Unspecified abnormalities of gait and mobility: Secondary | ICD-10-CM | POA: Diagnosis not present

## 2021-01-02 DIAGNOSIS — G808 Other cerebral palsy: Secondary | ICD-10-CM | POA: Diagnosis not present

## 2021-01-02 DIAGNOSIS — Z131 Encounter for screening for diabetes mellitus: Secondary | ICD-10-CM | POA: Diagnosis not present

## 2021-01-02 DIAGNOSIS — I10 Essential (primary) hypertension: Secondary | ICD-10-CM | POA: Diagnosis not present

## 2021-01-02 DIAGNOSIS — R600 Localized edema: Secondary | ICD-10-CM | POA: Diagnosis not present

## 2021-01-02 DIAGNOSIS — E782 Mixed hyperlipidemia: Secondary | ICD-10-CM | POA: Diagnosis not present

## 2021-01-15 ENCOUNTER — Other Ambulatory Visit: Payer: Self-pay | Admitting: Internal Medicine

## 2021-01-15 DIAGNOSIS — Z1231 Encounter for screening mammogram for malignant neoplasm of breast: Secondary | ICD-10-CM

## 2021-01-22 DIAGNOSIS — I10 Essential (primary) hypertension: Secondary | ICD-10-CM | POA: Diagnosis not present

## 2021-01-22 DIAGNOSIS — R600 Localized edema: Secondary | ICD-10-CM | POA: Diagnosis not present

## 2021-01-22 DIAGNOSIS — M79671 Pain in right foot: Secondary | ICD-10-CM | POA: Diagnosis not present

## 2021-01-22 DIAGNOSIS — G808 Other cerebral palsy: Secondary | ICD-10-CM | POA: Diagnosis not present

## 2021-01-22 DIAGNOSIS — M79672 Pain in left foot: Secondary | ICD-10-CM | POA: Diagnosis not present

## 2021-01-22 DIAGNOSIS — R269 Unspecified abnormalities of gait and mobility: Secondary | ICD-10-CM | POA: Diagnosis not present

## 2021-01-22 DIAGNOSIS — R7303 Prediabetes: Secondary | ICD-10-CM | POA: Diagnosis not present

## 2021-01-22 DIAGNOSIS — E782 Mixed hyperlipidemia: Secondary | ICD-10-CM | POA: Diagnosis not present

## 2021-02-09 ENCOUNTER — Ambulatory Visit
Admission: RE | Admit: 2021-02-09 | Discharge: 2021-02-09 | Disposition: A | Discharge status: Home / Self Care | Payer: Medicare HMO | Source: Ambulatory Visit | Attending: Internal Medicine | Admitting: Internal Medicine

## 2021-02-09 ENCOUNTER — Other Ambulatory Visit: Payer: Self-pay

## 2021-02-09 DIAGNOSIS — Z1231 Encounter for screening mammogram for malignant neoplasm of breast: Secondary | ICD-10-CM

## 2021-02-26 DIAGNOSIS — E782 Mixed hyperlipidemia: Secondary | ICD-10-CM | POA: Diagnosis not present

## 2021-02-26 DIAGNOSIS — R600 Localized edema: Secondary | ICD-10-CM | POA: Diagnosis not present

## 2021-02-26 DIAGNOSIS — M79672 Pain in left foot: Secondary | ICD-10-CM | POA: Diagnosis not present

## 2021-02-26 DIAGNOSIS — R269 Unspecified abnormalities of gait and mobility: Secondary | ICD-10-CM | POA: Diagnosis not present

## 2021-02-26 DIAGNOSIS — G808 Other cerebral palsy: Secondary | ICD-10-CM | POA: Diagnosis not present

## 2021-02-26 DIAGNOSIS — M79671 Pain in right foot: Secondary | ICD-10-CM | POA: Diagnosis not present

## 2021-02-26 DIAGNOSIS — I10 Essential (primary) hypertension: Secondary | ICD-10-CM | POA: Diagnosis not present

## 2021-02-26 DIAGNOSIS — R7303 Prediabetes: Secondary | ICD-10-CM | POA: Diagnosis not present

## 2021-03-20 ENCOUNTER — Ambulatory Visit: Payer: Medicare HMO | Admitting: Diagnostic Neuroimaging

## 2021-03-20 ENCOUNTER — Encounter: Payer: Self-pay | Admitting: *Deleted

## 2021-03-21 ENCOUNTER — Encounter: Payer: Self-pay | Admitting: Diagnostic Neuroimaging

## 2021-04-24 DIAGNOSIS — R600 Localized edema: Secondary | ICD-10-CM | POA: Diagnosis not present

## 2021-04-24 DIAGNOSIS — G808 Other cerebral palsy: Secondary | ICD-10-CM | POA: Diagnosis not present

## 2021-04-24 DIAGNOSIS — I10 Essential (primary) hypertension: Secondary | ICD-10-CM | POA: Diagnosis not present

## 2021-04-24 DIAGNOSIS — M79671 Pain in right foot: Secondary | ICD-10-CM | POA: Diagnosis not present

## 2021-04-24 DIAGNOSIS — R7303 Prediabetes: Secondary | ICD-10-CM | POA: Diagnosis not present

## 2021-04-24 DIAGNOSIS — R269 Unspecified abnormalities of gait and mobility: Secondary | ICD-10-CM | POA: Diagnosis not present

## 2021-04-24 DIAGNOSIS — M79672 Pain in left foot: Secondary | ICD-10-CM | POA: Diagnosis not present

## 2021-04-24 DIAGNOSIS — E782 Mixed hyperlipidemia: Secondary | ICD-10-CM | POA: Diagnosis not present

## 2021-05-22 DIAGNOSIS — E782 Mixed hyperlipidemia: Secondary | ICD-10-CM | POA: Diagnosis not present

## 2021-05-22 DIAGNOSIS — G808 Other cerebral palsy: Secondary | ICD-10-CM | POA: Diagnosis not present

## 2021-05-22 DIAGNOSIS — I10 Essential (primary) hypertension: Secondary | ICD-10-CM | POA: Diagnosis not present

## 2021-05-22 DIAGNOSIS — M79671 Pain in right foot: Secondary | ICD-10-CM | POA: Diagnosis not present

## 2021-05-22 DIAGNOSIS — F419 Anxiety disorder, unspecified: Secondary | ICD-10-CM | POA: Diagnosis not present

## 2021-05-22 DIAGNOSIS — R600 Localized edema: Secondary | ICD-10-CM | POA: Diagnosis not present

## 2021-05-22 DIAGNOSIS — R269 Unspecified abnormalities of gait and mobility: Secondary | ICD-10-CM | POA: Diagnosis not present

## 2021-05-22 DIAGNOSIS — R7303 Prediabetes: Secondary | ICD-10-CM | POA: Diagnosis not present

## 2021-05-25 DIAGNOSIS — H5203 Hypermetropia, bilateral: Secondary | ICD-10-CM | POA: Diagnosis not present

## 2021-05-25 DIAGNOSIS — H524 Presbyopia: Secondary | ICD-10-CM | POA: Diagnosis not present

## 2021-06-05 DIAGNOSIS — Z9889 Other specified postprocedural states: Secondary | ICD-10-CM | POA: Diagnosis not present

## 2021-06-05 DIAGNOSIS — M25372 Other instability, left ankle: Secondary | ICD-10-CM | POA: Diagnosis not present

## 2021-06-05 DIAGNOSIS — M792 Neuralgia and neuritis, unspecified: Secondary | ICD-10-CM | POA: Diagnosis not present

## 2021-06-05 DIAGNOSIS — M21371 Foot drop, right foot: Secondary | ICD-10-CM | POA: Diagnosis not present

## 2021-06-05 DIAGNOSIS — M25371 Other instability, right ankle: Secondary | ICD-10-CM | POA: Diagnosis not present

## 2021-08-06 ENCOUNTER — Ambulatory Visit (INDEPENDENT_AMBULATORY_CARE_PROVIDER_SITE_OTHER): Payer: Medicare HMO

## 2021-08-06 ENCOUNTER — Other Ambulatory Visit: Payer: Self-pay

## 2021-08-06 ENCOUNTER — Encounter (HOSPITAL_COMMUNITY): Payer: Self-pay | Admitting: *Deleted

## 2021-08-06 ENCOUNTER — Ambulatory Visit (HOSPITAL_COMMUNITY)
Admission: EM | Admit: 2021-08-06 | Discharge: 2021-08-06 | Disposition: A | Discharge status: Home / Self Care | Payer: Medicare HMO | Attending: Family Medicine | Admitting: Family Medicine

## 2021-08-06 DIAGNOSIS — M25561 Pain in right knee: Secondary | ICD-10-CM

## 2021-08-06 DIAGNOSIS — M546 Pain in thoracic spine: Secondary | ICD-10-CM

## 2021-08-06 DIAGNOSIS — M25521 Pain in right elbow: Secondary | ICD-10-CM

## 2021-08-06 DIAGNOSIS — W19XXXA Unspecified fall, initial encounter: Secondary | ICD-10-CM | POA: Diagnosis not present

## 2021-08-06 DIAGNOSIS — M549 Dorsalgia, unspecified: Secondary | ICD-10-CM | POA: Diagnosis not present

## 2021-08-06 DIAGNOSIS — M40204 Unspecified kyphosis, thoracic region: Secondary | ICD-10-CM | POA: Diagnosis not present

## 2021-08-06 MED ORDER — KETOROLAC TROMETHAMINE 30 MG/ML IJ SOLN
30.0000 mg | Freq: Once | INTRAMUSCULAR | Status: AC
  Administered 2021-08-06: 30 mg via INTRAMUSCULAR

## 2021-08-06 MED ORDER — KETOROLAC TROMETHAMINE 30 MG/ML IJ SOLN
INTRAMUSCULAR | Status: AC
  Filled 2021-08-06: qty 1

## 2021-08-06 MED ORDER — IBUPROFEN 800 MG PO TABS: 800.0000 mg | ORAL_TABLET | Freq: Three times a day (TID) | ORAL | 0 refills | Status: AC | PRN

## 2021-08-06 NOTE — ED Triage Notes (Signed)
Pt reports 2 falls  over last 2 weeks . Pt reports she had a trip and fall. Pt now has generalized body pain. ?

## 2021-08-06 NOTE — Discharge Instructions (Addendum)
Your xrays did not show any broken bones. ? 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 ? ?Take ibuprofen 800 mg--1 tab every 8 hours as needed for pain. ? ? You have been given a shot of Toradol 30 mg today.  ?

## 2021-08-06 NOTE — ED Provider Notes (Signed)
?[facility] ? ? ? ?CSN: [id number] ?Arrival date & time: 08/06/21  1703 ? ? ?  ? ?History   ?Chief Complaint ?Chief Complaint  ?Patient presents with  ? Fall  ? ? ?HPI ?Yvette Snyder is a 51 y.o. female.  ? ? ?Fall ? ?Here for pain in her right elbow to the mid forearm since she fell onto it yesterday afternoon.  Also fell onto her right knee.  And it hurts.  Also her mid upper back hurts when she breathes deeply.  No loss of consciousness but she is unable to tell me exactly why she fell.  She has fallen twice in the last 2 weeks and hurt the same parts of her body ? ?Past Medical History:  ?Diagnosis Date  ? Cerebral palsy (HCC)   ? Gait abnormality   ? Hypertension   ? ? ?Patient Active Problem List  ? Diagnosis Date Noted  ? Morbid obesity (HCC) 04/24/2016  ? OSA (obstructive sleep apnea) 04/24/2016  ? Preventative health care 02/21/2014  ? Amenorrhea 02/21/2014  ? Essential hypertension 01/11/2014  ? ? ?Past Surgical History:  ?Procedure Laterality Date  ? CESAREAN SECTION  2004  ? EYE SURGERY Bilateral [id number]  ? x 2  ? LEG SURGERY Bilateral 1977, 1981, 1989  ? x 3  ? TUBAL LIGATION  2004  ? ? ?OB History   ?No obstetric history on file. ?  ? ? ? ?Home Medications   ? ?Prior to Admission medications   ?Medication Sig Start Date End Date Taking? Authorizing Provider  ?ibuprofen (ADVIL) 800 MG tablet Take 1 tablet (800 mg total) by mouth every 8 (eight) hours as needed (pain). 08/06/21  Yes Zenia Resides, MD  ?ferrous sulfate 325 (65 FE) MG EC tablet Take 1 tablet (325 mg total) by mouth 3 (three) times daily with meals. ?Patient not taking: Reported on 04/24/2016 10/04/13   Ambrose Finland, NP  ?metoprolol succinate (TOPROL-XL) 25 MG 24 hr tablet Take 1 tablet (25 mg total) by mouth daily. 04/24/16   Quentin Angst, MD  ? ? ?Family History ?Family History  ?Problem Relation Age of Onset  ? Diabetes Mother   ? COPD Mother   ? ? ?Social History ?Social History  ? ?Tobacco Use  ? Smoking  status: Never  ?Substance Use Topics  ? Alcohol use: Yes  ?  Alcohol/week: 0.0 standard drinks  ? Drug use: No  ? ? ? ?Allergies   ?Sulfa antibiotics ? ? ?Review of Systems ?Review of Systems ? ? ?Physical Exam ?Triage Vital Signs ?ED Triage Vitals  ?Enc Vitals Group  ?   BP 08/06/21 1755 (!) 159/101  ?   Pulse Rate 08/06/21 1755 82  ?   Resp 08/06/21 1755 18  ?   Temp 08/06/21 1755 97.8 ?F (36.6 ?C)  ?   Temp src --   ?   SpO2 08/06/21 1755 96 %  ?   Weight --   ?   Height --   ?   Head Circumference --   ?   Peak Flow --   ?   Pain Score 08/06/21 1753 10  ?   Pain Loc --   ?   Pain Edu? --   ?   Excl. in GC? --   ? ?No data found. ? ?Updated Vital Signs ?BP (!) 159/101   Pulse 82   Temp 97.8 ?F (36.6 ?C)   Resp 18   LMP 04/02/2015  SpO2 96%  ? ?Visual Acuity ?Right Eye Distance:   ?Left Eye Distance:   ?Bilateral Distance:   ? ?Right Eye Near:   ?Left Eye Near:    ?Bilateral Near:    ? ?Physical Exam ?Vitals reviewed.  ?Constitutional:   ?   General: She is not in acute distress. ?   Appearance: She is not ill-appearing, toxic-appearing or diaphoretic.  ?Eyes:  ?   Extraocular Movements: Extraocular movements intact.  ?Cardiovascular:  ?   Rate and Rhythm: Normal rate and regular rhythm.  ?   Heart sounds: No murmur heard. ?Pulmonary:  ?   Effort: Pulmonary effort is normal.  ?   Breath sounds: Normal breath sounds.  ?Musculoskeletal:     ?   General: No swelling.  ?   Cervical back: Neck supple.  ?   Comments: There is some mild bruising and tenderness along the dorsal surface of the proximal forearm and onto the elbow.  There is also some tenderness over her anterior knee and over her lower thoracic area in the midline  ?Lymphadenopathy:  ?   Cervical: No cervical adenopathy.  ?Skin: ?   Coloration: Skin is not jaundiced or pale.  ?Neurological:  ?   General: No focal deficit present.  ?   Mental Status: She is alert and oriented to person, place, and time.  ?Psychiatric:     ?   Behavior: Behavior normal.   ? ? ? ?UC Treatments / Results  ?Labs ?(all labs ordered are listed, but only abnormal results are displayed) ?Labs Reviewed - No data to display ? ?EKG ? ? ?Radiology ?DG Thoracic Spine 2 View ? ?Result Date: 08/06/2021 ?CLINICAL DATA:  Fall, back pain EXAM: THORACIC SPINE 2 VIEWS COMPARISON:  None. FINDINGS: The examination is limited on lateral examination by overlying soft tissue. Mild thoracic dextrocurvature. Normal thoracic kyphosis. No definite fracture or listhesis. Mild degenerative changes are noted throughout the mid and lower thoracic spine. Paraspinal soft tissues are unremarkable. IMPRESSION: No acute fracture or listhesis. Electronically Signed   By: Helyn Numbers M.D.   On: 08/06/2021 19:35  ? ?DG Elbow Complete Right ? ?Result Date: 08/06/2021 ?CLINICAL DATA:  Right elbow pain EXAM: RIGHT ELBOW - COMPLETE 3+ VIEW COMPARISON:  None. FINDINGS: There is no evidence of fracture, dislocation, or joint effusion. There is no evidence of arthropathy or other focal bone abnormality. Soft tissues are unremarkable. IMPRESSION: Negative. Electronically Signed   By: Helyn Numbers M.D.   On: 08/06/2021 19:29  ? ?DG Knee 2 Views Right ? ?Result Date: 08/06/2021 ?CLINICAL DATA:  Fall, right knee pain EXAM: RIGHT KNEE - 1-2 VIEW COMPARISON:  None. FINDINGS: No evidence of fracture, dislocation, or joint effusion. No evidence of arthropathy or other focal bone abnormality. Soft tissues are unremarkable. IMPRESSION: Negative. Electronically Signed   By: Helyn Numbers M.D.   On: 08/06/2021 19:31   ? ?Procedures ?Procedures (including critical care time) ? ?Medications Ordered in UC ?Medications  ?ketorolac (TORADOL) 30 MG/ML injection 30 mg (has no administration in time range)  ? ? ?Initial Impression / Assessment and Plan / UC Course  ?I have reviewed the triage vital signs and the nursing notes. ? ?Pertinent labs & imaging results that were available during my care of the patient were reviewed by me and considered  in my medical decision making (see chart for details). ? ?  ? ?Xrays are negative for fracture. Toradol for pain and ibuprofen sent to the pharmacy ?Final Clinical Impressions(s) /  UC Diagnoses  ? ?Final diagnoses:  ?Fall, initial encounter  ?Right elbow pain  ?Acute pain of right knee  ?Upper back pain  ? ? ? ?Discharge Instructions   ? ?  ?Your xrays did not show any broken bones. ? 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 ? ?Take ibuprofen 800 mg--1 tab every 8 hours as needed for pain. ? ? You have been given a shot of Toradol 30 mg today.  ? ? ? ? ?ED Prescriptions   ? ? Medication Sig Dispense Auth. Provider  ? ibuprofen (ADVIL) 800 MG tablet Take 1 tablet (800 mg total) by mouth every 8 (eight) hours as needed (pain). 21 tablet Zenia ResidesBanister, Jadore Mcguffin K, MD  ? ?  ? ?I have reviewed the PDMP during this encounter. ?  ?Zenia ResidesBanister, Judeth Gilles K, MD ?08/06/21 1942 ? ?

## 2021-11-07 DIAGNOSIS — R269 Unspecified abnormalities of gait and mobility: Secondary | ICD-10-CM | POA: Diagnosis not present

## 2021-11-07 DIAGNOSIS — G808 Other cerebral palsy: Secondary | ICD-10-CM | POA: Diagnosis not present

## 2021-11-07 DIAGNOSIS — E782 Mixed hyperlipidemia: Secondary | ICD-10-CM | POA: Diagnosis not present

## 2021-11-07 DIAGNOSIS — I10 Essential (primary) hypertension: Secondary | ICD-10-CM | POA: Diagnosis not present

## 2021-11-07 DIAGNOSIS — Z0000 Encounter for general adult medical examination without abnormal findings: Secondary | ICD-10-CM | POA: Diagnosis not present

## 2021-11-07 DIAGNOSIS — R600 Localized edema: Secondary | ICD-10-CM | POA: Diagnosis not present

## 2021-11-07 DIAGNOSIS — F419 Anxiety disorder, unspecified: Secondary | ICD-10-CM | POA: Diagnosis not present

## 2021-11-07 DIAGNOSIS — R7303 Prediabetes: Secondary | ICD-10-CM | POA: Diagnosis not present

## 2021-11-07 DIAGNOSIS — N3941 Urge incontinence: Secondary | ICD-10-CM | POA: Diagnosis not present

## 2022-01-16 DIAGNOSIS — S93402A Sprain of unspecified ligament of left ankle, initial encounter: Secondary | ICD-10-CM | POA: Diagnosis not present

## 2022-01-16 DIAGNOSIS — M722 Plantar fascial fibromatosis: Secondary | ICD-10-CM | POA: Diagnosis not present

## 2022-01-21 DIAGNOSIS — Z0001 Encounter for general adult medical examination with abnormal findings: Secondary | ICD-10-CM | POA: Diagnosis not present

## 2022-01-21 DIAGNOSIS — R7303 Prediabetes: Secondary | ICD-10-CM | POA: Diagnosis not present

## 2022-01-21 DIAGNOSIS — R600 Localized edema: Secondary | ICD-10-CM | POA: Diagnosis not present

## 2022-01-21 DIAGNOSIS — N3941 Urge incontinence: Secondary | ICD-10-CM | POA: Diagnosis not present

## 2022-01-21 DIAGNOSIS — E782 Mixed hyperlipidemia: Secondary | ICD-10-CM | POA: Diagnosis not present

## 2022-01-21 DIAGNOSIS — G808 Other cerebral palsy: Secondary | ICD-10-CM | POA: Diagnosis not present

## 2022-01-21 DIAGNOSIS — R269 Unspecified abnormalities of gait and mobility: Secondary | ICD-10-CM | POA: Diagnosis not present

## 2022-01-21 DIAGNOSIS — I10 Essential (primary) hypertension: Secondary | ICD-10-CM | POA: Diagnosis not present

## 2023-05-28 ENCOUNTER — Ambulatory Visit: Payer: Self-pay | Admitting: Nurse Practitioner
